# Patient Record
Sex: Female | Born: 1935 | Race: White | Hispanic: No | Marital: Married | Smoking: Never smoker
Health system: Southern US, Community
[De-identification: ages and names within clinical notes are randomized; demographics above are authoritative.]

## PROBLEM LIST (undated history)

## (undated) DIAGNOSIS — E785 Hyperlipidemia, unspecified: Secondary | ICD-10-CM

## (undated) DIAGNOSIS — D649 Anemia, unspecified: Secondary | ICD-10-CM

## (undated) DIAGNOSIS — M810 Age-related osteoporosis without current pathological fracture: Secondary | ICD-10-CM

## (undated) DIAGNOSIS — M1990 Unspecified osteoarthritis, unspecified site: Secondary | ICD-10-CM

## (undated) DIAGNOSIS — M75102 Unspecified rotator cuff tear or rupture of left shoulder, not specified as traumatic: Secondary | ICD-10-CM

## (undated) DIAGNOSIS — I4891 Unspecified atrial fibrillation: Secondary | ICD-10-CM

## (undated) HISTORY — DX: Unspecified atrial fibrillation: I48.91

## (undated) HISTORY — PX: APPENDECTOMY: SHX54

## (undated) HISTORY — DX: Anemia, unspecified: D64.9

## (undated) HISTORY — PX: CATARACT EXTRACTION, BILATERAL: SHX1313

## (undated) HISTORY — DX: Unspecified osteoarthritis, unspecified site: M19.90

## (undated) HISTORY — DX: Age-related osteoporosis without current pathological fracture: M81.0

## (undated) HISTORY — DX: Unspecified rotator cuff tear or rupture of left shoulder, not specified as traumatic: M75.102

## (undated) HISTORY — PX: BILATERAL CARPAL TUNNEL RELEASE: SHX6508

## (undated) HISTORY — DX: Hyperlipidemia, unspecified: E78.5

---

## 2003-12-12 ENCOUNTER — Ambulatory Visit: Payer: Self-pay | Admitting: Family Medicine

## 2006-03-29 ENCOUNTER — Ambulatory Visit: Payer: Self-pay | Admitting: Family Medicine

## 2006-05-05 ENCOUNTER — Other Ambulatory Visit: Payer: Self-pay

## 2006-05-06 ENCOUNTER — Observation Stay: Payer: Self-pay | Admitting: Internal Medicine

## 2006-05-28 ENCOUNTER — Ambulatory Visit: Payer: Self-pay | Admitting: Unknown Physician Specialty

## 2006-06-12 ENCOUNTER — Ambulatory Visit: Payer: Self-pay | Admitting: Unknown Physician Specialty

## 2006-06-15 ENCOUNTER — Ambulatory Visit: Payer: Self-pay | Admitting: Unknown Physician Specialty

## 2006-07-05 ENCOUNTER — Ambulatory Visit: Payer: Self-pay | Admitting: Unknown Physician Specialty

## 2006-07-05 ENCOUNTER — Other Ambulatory Visit: Payer: Self-pay

## 2006-07-08 ENCOUNTER — Ambulatory Visit: Payer: Self-pay | Admitting: Unknown Physician Specialty

## 2006-08-04 ENCOUNTER — Ambulatory Visit: Payer: Self-pay | Admitting: Family Medicine

## 2006-11-29 ENCOUNTER — Ambulatory Visit: Payer: Self-pay | Admitting: Family Medicine

## 2009-05-25 ENCOUNTER — Ambulatory Visit: Payer: Self-pay | Admitting: Internal Medicine

## 2010-05-12 ENCOUNTER — Ambulatory Visit: Payer: Self-pay | Admitting: Internal Medicine

## 2010-09-21 ENCOUNTER — Inpatient Hospital Stay: Payer: Self-pay | Admitting: Internal Medicine

## 2010-11-17 ENCOUNTER — Emergency Department: Payer: Self-pay | Admitting: Emergency Medicine

## 2011-01-23 ENCOUNTER — Ambulatory Visit: Payer: Self-pay | Admitting: Internal Medicine

## 2011-02-12 ENCOUNTER — Ambulatory Visit: Payer: Self-pay | Admitting: Internal Medicine

## 2011-02-12 LAB — CBC CANCER CENTER
Eosinophil %: 0.9 %
Eosinophil: 1 %
HGB: 9.8 g/dL — ABNORMAL LOW (ref 12.0–16.0)
Lymphocyte %: 22.9 %
Lymphocytes: 26 %
MCH: 24.5 pg — ABNORMAL LOW (ref 26.0–34.0)
Monocyte %: 4.5 %
Neutrophil %: 71.5 %
Platelet: 333 x10 3/mm (ref 150–440)
RBC: 3.98 10*6/uL (ref 3.80–5.20)
RDW: 15.5 % — ABNORMAL HIGH (ref 11.5–14.5)
Segmented Neutrophils: 69 %
WBC: 9.1 x10 3/mm (ref 3.6–11.0)

## 2011-02-12 LAB — RETICULOCYTES
Absolute Retic Count: 0.145 10*6/uL — ABNORMAL HIGH (ref 0.024–0.084)
Reticulocyte: 3.6 % — ABNORMAL HIGH (ref 0.5–1.5)

## 2011-02-12 LAB — FERRITIN: Ferritin (ARMC): 8 ng/mL (ref 8–388)

## 2011-02-18 LAB — URINE IEP, RANDOM

## 2011-02-27 ENCOUNTER — Ambulatory Visit: Payer: Self-pay | Admitting: Internal Medicine

## 2011-04-09 ENCOUNTER — Ambulatory Visit: Payer: Self-pay | Admitting: Internal Medicine

## 2011-04-09 LAB — CBC CANCER CENTER
Basophil #: 0 x10 3/mm (ref 0.0–0.1)
Eosinophil %: 0 %
HGB: 9.1 g/dL — ABNORMAL LOW (ref 12.0–16.0)
Lymphocyte #: 1.4 x10 3/mm (ref 1.0–3.6)
MCH: 22.5 pg — ABNORMAL LOW (ref 26.0–34.0)
MCHC: 30.6 g/dL — ABNORMAL LOW (ref 32.0–36.0)
MCV: 73.4 fL — ABNORMAL LOW (ref 80–100)
Neutrophil %: 80.2 %
RBC: 4.06 10*6/uL (ref 3.80–5.20)
RDW: 16.7 % — ABNORMAL HIGH (ref 11.5–14.5)

## 2011-04-09 LAB — RETICULOCYTES
Absolute Retic Count: 0.121 10*6/uL — ABNORMAL HIGH (ref 0.024–0.084)
Reticulocyte: 3.1 % — ABNORMAL HIGH (ref 0.5–1.5)

## 2011-04-27 ENCOUNTER — Ambulatory Visit: Payer: Self-pay | Admitting: Internal Medicine

## 2011-04-30 ENCOUNTER — Emergency Department: Payer: Self-pay | Admitting: Emergency Medicine

## 2011-04-30 LAB — URINALYSIS, COMPLETE
Bacteria: NONE SEEN
Bilirubin,UR: NEGATIVE
Blood: NEGATIVE
Glucose,UR: NEGATIVE mg/dL (ref 0–75)
Ketone: NEGATIVE
Leukocyte Esterase: NEGATIVE
Nitrite: NEGATIVE
Protein: NEGATIVE
Specific Gravity: 1.009 (ref 1.003–1.030)
Squamous Epithelial: <1

## 2011-04-30 LAB — CBC
MCV: 72 fL — ABNORMAL LOW (ref 80–100)
RBC: 4.29 10*6/uL (ref 3.80–5.20)
WBC: 11.2 10*3/uL — ABNORMAL HIGH (ref 3.6–11.0)

## 2011-04-30 LAB — BASIC METABOLIC PANEL
Anion Gap: 12 (ref 7–16)
Calcium, Total: 8.6 mg/dL (ref 8.5–10.1)
Co2: 28 mmol/L (ref 21–32)
Creatinine: 0.67 mg/dL (ref 0.60–1.30)
EGFR (African American): >60
Glucose: 94 mg/dL (ref 65–99)
Osmolality: 284 (ref 275–301)
Potassium: 3.1 mmol/L — ABNORMAL LOW (ref 3.5–5.1)

## 2011-04-30 LAB — PROTIME-INR: Prothrombin Time: 28.3 secs — ABNORMAL HIGH (ref 11.5–14.7)

## 2011-04-30 LAB — TROPONIN I: Troponin-I: <0.02 ng/mL

## 2011-07-28 ENCOUNTER — Emergency Department: Payer: Self-pay | Admitting: Emergency Medicine

## 2011-07-28 LAB — URINALYSIS, COMPLETE
Bacteria: NONE SEEN
Bilirubin,UR: NEGATIVE
Blood: NEGATIVE
Glucose,UR: NEGATIVE mg/dL (ref 0–75)
Ketone: NEGATIVE
Nitrite: NEGATIVE
Specific Gravity: 1.021 (ref 1.003–1.030)
Squamous Epithelial: 1
WBC UR: 1 /HPF (ref 0–5)

## 2011-07-28 LAB — TROPONIN I
Troponin-I: <0.02 ng/mL
Troponin-I: <0.02 ng/mL

## 2011-07-28 LAB — CBC
HCT: 35.9 % (ref 35.0–47.0)
HGB: 10.8 g/dL — ABNORMAL LOW (ref 12.0–16.0)
RBC: 4.91 10*6/uL (ref 3.80–5.20)

## 2011-07-28 LAB — CK TOTAL AND CKMB (NOT AT ARMC)
CK, Total: 37 U/L (ref 21–215)
CK-MB: 0.8 ng/mL (ref 0.5–3.6)

## 2011-07-28 LAB — T4, FREE: Free Thyroxine: 0.99 ng/dL (ref 0.76–1.46)

## 2011-07-28 LAB — COMPREHENSIVE METABOLIC PANEL
Albumin: 3.5 g/dL (ref 3.4–5.0)
Alkaline Phosphatase: 81 U/L (ref 50–136)
Anion Gap: 6 — ABNORMAL LOW (ref 7–16)
Calcium, Total: 9 mg/dL (ref 8.5–10.1)
EGFR (Non-African Amer.): >60
Glucose: 109 mg/dL — ABNORMAL HIGH (ref 65–99)
Potassium: 4.3 mmol/L (ref 3.5–5.1)
SGOT(AST): 17 U/L (ref 15–37)
SGPT (ALT): 20 U/L
Total Protein: 7.5 g/dL (ref 6.4–8.2)

## 2011-07-28 LAB — PROTIME-INR: INR: 2.1

## 2011-07-28 LAB — TSH: Thyroid Stimulating Horm: 0.945 u[IU]/mL

## 2011-08-06 ENCOUNTER — Ambulatory Visit: Payer: Self-pay | Admitting: Internal Medicine

## 2011-08-06 LAB — CBC CANCER CENTER
Basophil #: 0 x10 3/mm (ref 0.0–0.1)
Basophil %: 0.2 %
Eosinophil #: 0 x10 3/mm (ref 0.0–0.7)
Lymphocyte %: 9 %
MCHC: 30.3 g/dL — ABNORMAL LOW (ref 32.0–36.0)
Monocyte #: 0.5 x10 3/mm (ref 0.2–0.9)
Neutrophil #: 10.7 x10 3/mm — ABNORMAL HIGH (ref 1.4–6.5)
Neutrophil %: 86.6 %
RBC: 4.28 10*6/uL (ref 3.80–5.20)
RDW: 19.4 % — ABNORMAL HIGH (ref 11.5–14.5)
WBC: 12.4 x10 3/mm — ABNORMAL HIGH (ref 3.6–11.0)

## 2011-08-06 LAB — IRON AND TIBC
Iron Bind.Cap.(Total): 423 ug/dL (ref 250–450)
Iron Saturation: 16 %
Iron: 68 ug/dL (ref 50–170)
Unbound Iron-Bind.Cap.: 355 ug/dL

## 2011-08-06 LAB — FERRITIN: Ferritin (ARMC): 14 ng/mL (ref 8–388)

## 2011-08-27 ENCOUNTER — Ambulatory Visit: Payer: Self-pay | Admitting: Internal Medicine

## 2011-11-19 ENCOUNTER — Ambulatory Visit: Payer: Self-pay | Admitting: Internal Medicine

## 2011-11-19 LAB — CBC CANCER CENTER
Eosinophil #: 0.2 x10 3/mm (ref 0.0–0.7)
HCT: 33.3 % — ABNORMAL LOW (ref 35.0–47.0)
HGB: 10.2 g/dL — ABNORMAL LOW (ref 12.0–16.0)
Lymphocyte #: 1.7 x10 3/mm (ref 1.0–3.6)
MCHC: 30.5 g/dL — ABNORMAL LOW (ref 32.0–36.0)
Monocyte #: 0.6 x10 3/mm (ref 0.2–0.9)
Neutrophil %: 71.6 %
RBC: 4.37 10*6/uL (ref 3.80–5.20)
WBC: 8.8 x10 3/mm (ref 3.6–11.0)

## 2011-11-19 LAB — IRON AND TIBC
Iron Bind.Cap.(Total): 432 ug/dL (ref 250–450)
Iron: 25 ug/dL — ABNORMAL LOW (ref 50–170)
Unbound Iron-Bind.Cap.: 407 ug/dL

## 2011-11-27 ENCOUNTER — Ambulatory Visit: Payer: Self-pay | Admitting: Internal Medicine

## 2011-12-27 ENCOUNTER — Ambulatory Visit: Payer: Self-pay | Admitting: Internal Medicine

## 2012-01-27 ENCOUNTER — Ambulatory Visit: Payer: Self-pay | Admitting: Internal Medicine

## 2012-02-18 ENCOUNTER — Ambulatory Visit: Payer: Self-pay | Admitting: Rheumatology

## 2012-02-18 LAB — CBC CANCER CENTER
Basophil #: 0.1 x10 3/mm (ref 0.0–0.1)
Basophil %: 0.6 %
HCT: 39.6 % (ref 35.0–47.0)
HGB: 12.6 g/dL (ref 12.0–16.0)
Lymphocyte #: 1.8 x10 3/mm (ref 1.0–3.6)
Lymphocyte %: 15.6 %
MCH: 25.2 pg — ABNORMAL LOW (ref 26.0–34.0)
MCV: 79 fL — ABNORMAL LOW (ref 80–100)
Monocyte #: 0.5 x10 3/mm (ref 0.2–0.9)
Neutrophil %: 78.9 %
Platelet: 262 x10 3/mm (ref 150–440)
RBC: 5 10*6/uL (ref 3.80–5.20)
RDW: 19.7 % — ABNORMAL HIGH (ref 11.5–14.5)

## 2012-02-18 LAB — IRON AND TIBC
Iron Bind.Cap.(Total): 371 ug/dL (ref 250–450)
Iron Saturation: 11 %
Iron: 42 ug/dL — ABNORMAL LOW (ref 50–170)

## 2012-02-18 LAB — CREATININE, SERUM: EGFR (Non-African Amer.): >60

## 2012-02-18 LAB — FERRITIN: Ferritin (ARMC): 76 ng/mL (ref 8–388)

## 2012-02-18 LAB — SEDIMENTATION RATE: Erythrocyte Sed Rate: 21 mm/hr (ref 0–30)

## 2012-02-27 ENCOUNTER — Ambulatory Visit: Payer: Self-pay | Admitting: Internal Medicine

## 2012-03-26 ENCOUNTER — Ambulatory Visit: Payer: Self-pay | Admitting: Internal Medicine

## 2012-04-26 ENCOUNTER — Ambulatory Visit: Payer: Self-pay | Admitting: Internal Medicine

## 2012-05-26 ENCOUNTER — Ambulatory Visit: Payer: Self-pay | Admitting: Internal Medicine

## 2012-05-26 LAB — IRON AND TIBC
Iron Bind.Cap.(Total): 335 ug/dL (ref 250–450)
Iron: 71 ug/dL (ref 50–170)
Unbound Iron-Bind.Cap.: 264 ug/dL

## 2012-05-26 LAB — CANCER CENTER HEMOGLOBIN: HGB: 12.9 g/dL (ref 12.0–16.0)

## 2012-05-26 LAB — FERRITIN: Ferritin (ARMC): 88 ng/mL (ref 8–388)

## 2012-06-26 ENCOUNTER — Ambulatory Visit: Payer: Self-pay | Admitting: Internal Medicine

## 2012-07-26 ENCOUNTER — Ambulatory Visit: Payer: Self-pay | Admitting: Internal Medicine

## 2012-08-26 ENCOUNTER — Ambulatory Visit: Payer: Self-pay | Admitting: Internal Medicine

## 2012-09-15 LAB — CBC CANCER CENTER
Basophil #: 0.1 x10 3/mm (ref 0.0–0.1)
Basophil %: 1.1 %
Eosinophil #: 0.3 x10 3/mm (ref 0.0–0.7)
Eosinophil %: 5.1 %
HCT: 42.1 % (ref 35.0–47.0)
HGB: 13.9 g/dL (ref 12.0–16.0)
Lymphocyte %: 48.9 %
MCH: 29.1 pg (ref 26.0–34.0)
Monocyte #: 0.7 x10 3/mm (ref 0.2–0.9)
Monocyte %: 12.2 %
Neutrophil #: 1.8 x10 3/mm (ref 1.4–6.5)
Platelet: 177 x10 3/mm (ref 150–440)
RBC: 4.78 10*6/uL (ref 3.80–5.20)
RDW: 13.8 % (ref 11.5–14.5)
WBC: 5.5 x10 3/mm (ref 3.6–11.0)

## 2012-09-15 LAB — IRON AND TIBC
Iron Bind.Cap.(Total): 419 ug/dL (ref 250–450)
Iron Saturation: 34 %
Unbound Iron-Bind.Cap.: 278 ug/dL

## 2012-09-15 LAB — FERRITIN: Ferritin (ARMC): 94 ng/mL (ref 8–388)

## 2012-09-26 ENCOUNTER — Ambulatory Visit: Payer: Self-pay | Admitting: Internal Medicine

## 2012-12-21 ENCOUNTER — Ambulatory Visit: Payer: Self-pay | Admitting: Family Medicine

## 2012-12-21 LAB — CBC WITH DIFFERENTIAL/PLATELET
Basophil #: 0.1 10*3/uL (ref 0.0–0.1)
Eosinophil #: 0.3 10*3/uL (ref 0.0–0.7)
HGB: 14.2 g/dL (ref 12.0–16.0)
Lymphocyte #: 2.1 10*3/uL (ref 1.0–3.6)
Lymphocyte %: 37.6 %
MCHC: 33.2 g/dL (ref 32.0–36.0)
MCV: 90 fL (ref 80–100)
Monocyte #: 0.8 x10 3/mm (ref 0.2–0.9)
Neutrophil #: 2.3 10*3/uL (ref 1.4–6.5)
RBC: 4.76 10*6/uL (ref 3.80–5.20)
RDW: 13.4 % (ref 11.5–14.5)
WBC: 5.6 10*3/uL (ref 3.6–11.0)

## 2013-02-01 ENCOUNTER — Ambulatory Visit: Payer: Self-pay | Admitting: Internal Medicine

## 2013-02-01 DIAGNOSIS — D509 Iron deficiency anemia, unspecified: Secondary | ICD-10-CM | POA: Diagnosis not present

## 2013-02-01 LAB — HEMOGLOBIN: HGB: 14.5 g/dL (ref 12.0–16.0)

## 2013-02-01 LAB — IRON AND TIBC
Iron Bind.Cap.(Total): 454 ug/dL — ABNORMAL HIGH (ref 250–450)
Iron Saturation: 27 %
Iron: 121 ug/dL (ref 50–170)
UNBOUND IRON-BIND. CAP.: 333 ug/dL

## 2013-02-01 LAB — FERRITIN: Ferritin (ARMC): 45 ng/mL (ref 8–388)

## 2013-02-07 DIAGNOSIS — I6529 Occlusion and stenosis of unspecified carotid artery: Secondary | ICD-10-CM | POA: Diagnosis not present

## 2013-02-07 DIAGNOSIS — I4949 Other premature depolarization: Secondary | ICD-10-CM | POA: Diagnosis not present

## 2013-02-07 DIAGNOSIS — M79609 Pain in unspecified limb: Secondary | ICD-10-CM | POA: Diagnosis not present

## 2013-02-07 DIAGNOSIS — I4891 Unspecified atrial fibrillation: Secondary | ICD-10-CM | POA: Diagnosis not present

## 2013-02-09 DIAGNOSIS — Z7901 Long term (current) use of anticoagulants: Secondary | ICD-10-CM | POA: Diagnosis not present

## 2013-02-23 DIAGNOSIS — Z7901 Long term (current) use of anticoagulants: Secondary | ICD-10-CM | POA: Diagnosis not present

## 2013-02-26 ENCOUNTER — Ambulatory Visit: Payer: Self-pay | Admitting: Internal Medicine

## 2013-02-27 DIAGNOSIS — M069 Rheumatoid arthritis, unspecified: Secondary | ICD-10-CM | POA: Diagnosis not present

## 2013-02-27 DIAGNOSIS — M810 Age-related osteoporosis without current pathological fracture: Secondary | ICD-10-CM | POA: Diagnosis not present

## 2013-02-27 DIAGNOSIS — D649 Anemia, unspecified: Secondary | ICD-10-CM | POA: Diagnosis not present

## 2013-03-16 DIAGNOSIS — M069 Rheumatoid arthritis, unspecified: Secondary | ICD-10-CM | POA: Diagnosis not present

## 2013-03-16 DIAGNOSIS — I517 Cardiomegaly: Secondary | ICD-10-CM | POA: Diagnosis not present

## 2013-03-16 DIAGNOSIS — I119 Hypertensive heart disease without heart failure: Secondary | ICD-10-CM | POA: Diagnosis not present

## 2013-03-16 DIAGNOSIS — I4891 Unspecified atrial fibrillation: Secondary | ICD-10-CM | POA: Diagnosis not present

## 2013-03-16 DIAGNOSIS — Z79899 Other long term (current) drug therapy: Secondary | ICD-10-CM | POA: Diagnosis not present

## 2013-03-16 DIAGNOSIS — I359 Nonrheumatic aortic valve disorder, unspecified: Secondary | ICD-10-CM | POA: Diagnosis not present

## 2013-03-22 DIAGNOSIS — Z7901 Long term (current) use of anticoagulants: Secondary | ICD-10-CM | POA: Diagnosis not present

## 2013-04-06 DIAGNOSIS — M069 Rheumatoid arthritis, unspecified: Secondary | ICD-10-CM | POA: Diagnosis not present

## 2013-04-20 DIAGNOSIS — Z7901 Long term (current) use of anticoagulants: Secondary | ICD-10-CM | POA: Diagnosis not present

## 2013-04-20 DIAGNOSIS — E7800 Pure hypercholesterolemia, unspecified: Secondary | ICD-10-CM | POA: Diagnosis not present

## 2013-04-21 DIAGNOSIS — Z7901 Long term (current) use of anticoagulants: Secondary | ICD-10-CM | POA: Diagnosis not present

## 2013-04-21 DIAGNOSIS — I4891 Unspecified atrial fibrillation: Secondary | ICD-10-CM | POA: Diagnosis not present

## 2013-04-21 DIAGNOSIS — I10 Essential (primary) hypertension: Secondary | ICD-10-CM | POA: Diagnosis not present

## 2013-04-21 DIAGNOSIS — E7800 Pure hypercholesterolemia, unspecified: Secondary | ICD-10-CM | POA: Diagnosis not present

## 2013-05-04 DIAGNOSIS — Z7901 Long term (current) use of anticoagulants: Secondary | ICD-10-CM | POA: Diagnosis not present

## 2013-05-15 DIAGNOSIS — M069 Rheumatoid arthritis, unspecified: Secondary | ICD-10-CM | POA: Diagnosis not present

## 2013-05-18 DIAGNOSIS — Z7901 Long term (current) use of anticoagulants: Secondary | ICD-10-CM | POA: Diagnosis not present

## 2013-06-01 ENCOUNTER — Ambulatory Visit: Payer: Self-pay | Admitting: Internal Medicine

## 2013-06-01 DIAGNOSIS — D509 Iron deficiency anemia, unspecified: Secondary | ICD-10-CM | POA: Diagnosis not present

## 2013-06-01 DIAGNOSIS — Z79899 Other long term (current) drug therapy: Secondary | ICD-10-CM | POA: Diagnosis not present

## 2013-06-01 LAB — IRON AND TIBC
IRON BIND. CAP.(TOTAL): 435 ug/dL (ref 250–450)
Iron Saturation: 9 %
Iron: 39 ug/dL — ABNORMAL LOW (ref 50–170)
Unbound Iron-Bind.Cap.: 396 ug/dL

## 2013-06-01 LAB — HEMOGLOBIN: HGB: 11.9 g/dL — ABNORMAL LOW (ref 12.0–16.0)

## 2013-06-01 LAB — FERRITIN: FERRITIN (ARMC): 32 ng/mL (ref 8–388)

## 2013-06-07 LAB — OCCULT BLOOD X 1 CARD TO LAB, STOOL
Occult Blood, Feces: POSITIVE
Occult Blood, Feces: NEGATIVE

## 2013-06-15 DIAGNOSIS — Z7901 Long term (current) use of anticoagulants: Secondary | ICD-10-CM | POA: Diagnosis not present

## 2013-06-28 ENCOUNTER — Ambulatory Visit: Payer: Self-pay | Admitting: Internal Medicine

## 2013-06-28 DIAGNOSIS — D509 Iron deficiency anemia, unspecified: Secondary | ICD-10-CM | POA: Diagnosis not present

## 2013-06-28 DIAGNOSIS — Z79899 Other long term (current) drug therapy: Secondary | ICD-10-CM | POA: Diagnosis not present

## 2013-07-12 ENCOUNTER — Emergency Department: Payer: Self-pay | Admitting: Emergency Medicine

## 2013-07-12 DIAGNOSIS — S0993XA Unspecified injury of face, initial encounter: Secondary | ICD-10-CM | POA: Diagnosis not present

## 2013-07-12 DIAGNOSIS — S0990XA Unspecified injury of head, initial encounter: Secondary | ICD-10-CM | POA: Diagnosis not present

## 2013-07-12 DIAGNOSIS — Z7901 Long term (current) use of anticoagulants: Secondary | ICD-10-CM | POA: Diagnosis not present

## 2013-07-12 DIAGNOSIS — I10 Essential (primary) hypertension: Secondary | ICD-10-CM | POA: Diagnosis not present

## 2013-07-12 DIAGNOSIS — M542 Cervicalgia: Secondary | ICD-10-CM | POA: Diagnosis not present

## 2013-07-12 DIAGNOSIS — I4891 Unspecified atrial fibrillation: Secondary | ICD-10-CM | POA: Diagnosis not present

## 2013-07-12 DIAGNOSIS — S42309A Unspecified fracture of shaft of humerus, unspecified arm, initial encounter for closed fracture: Secondary | ICD-10-CM | POA: Diagnosis not present

## 2013-07-12 DIAGNOSIS — S46909A Unspecified injury of unspecified muscle, fascia and tendon at shoulder and upper arm level, unspecified arm, initial encounter: Secondary | ICD-10-CM | POA: Diagnosis not present

## 2013-07-12 DIAGNOSIS — R51 Headache: Secondary | ICD-10-CM | POA: Diagnosis not present

## 2013-07-12 DIAGNOSIS — S4980XA Other specified injuries of shoulder and upper arm, unspecified arm, initial encounter: Secondary | ICD-10-CM | POA: Diagnosis not present

## 2013-07-12 DIAGNOSIS — S42293A Other displaced fracture of upper end of unspecified humerus, initial encounter for closed fracture: Secondary | ICD-10-CM | POA: Diagnosis not present

## 2013-07-12 DIAGNOSIS — IMO0002 Reserved for concepts with insufficient information to code with codable children: Secondary | ICD-10-CM | POA: Diagnosis not present

## 2013-07-12 LAB — BASIC METABOLIC PANEL
Anion Gap: 5 — ABNORMAL LOW (ref 7–16)
BUN: 15 mg/dL (ref 7–18)
CHLORIDE: 107 mmol/L (ref 98–107)
CO2: 27 mmol/L (ref 21–32)
CREATININE: 0.42 mg/dL — AB (ref 0.60–1.30)
Calcium, Total: 8.5 mg/dL (ref 8.5–10.1)
Glucose: 89 mg/dL (ref 65–99)
OSMOLALITY: 278 (ref 275–301)
Potassium: 4.5 mmol/L (ref 3.5–5.1)
SODIUM: 139 mmol/L (ref 136–145)

## 2013-07-12 LAB — CBC WITH DIFFERENTIAL/PLATELET
BASOS ABS: 0.1 10*3/uL (ref 0.0–0.1)
Basophil %: 0.5 %
Eosinophil #: 0.1 10*3/uL (ref 0.0–0.7)
Eosinophil %: 1.1 %
HCT: 38.1 % (ref 35.0–47.0)
HGB: 11.8 g/dL — ABNORMAL LOW (ref 12.0–16.0)
Lymphocyte #: 2 10*3/uL (ref 1.0–3.6)
Lymphocyte %: 21 %
MCH: 26.6 pg (ref 26.0–34.0)
MCHC: 31.1 g/dL — AB (ref 32.0–36.0)
MCV: 86 fL (ref 80–100)
MONO ABS: 0.7 x10 3/mm (ref 0.2–0.9)
Monocyte %: 6.8 %
NEUTROS PCT: 70.6 %
Neutrophil #: 6.8 10*3/uL — ABNORMAL HIGH (ref 1.4–6.5)
Platelet: 224 10*3/uL (ref 150–440)
RBC: 4.46 10*6/uL (ref 3.80–5.20)
RDW: 16.2 % — AB (ref 11.5–14.5)
WBC: 9.7 10*3/uL (ref 3.6–11.0)

## 2013-07-12 LAB — PROTIME-INR
INR: 1.9
Prothrombin Time: 21.4 secs — ABNORMAL HIGH (ref 11.5–14.7)

## 2013-07-17 DIAGNOSIS — S42213A Unspecified displaced fracture of surgical neck of unspecified humerus, initial encounter for closed fracture: Secondary | ICD-10-CM | POA: Diagnosis not present

## 2013-07-17 DIAGNOSIS — S42293A Other displaced fracture of upper end of unspecified humerus, initial encounter for closed fracture: Secondary | ICD-10-CM | POA: Diagnosis not present

## 2013-07-26 ENCOUNTER — Ambulatory Visit: Payer: Self-pay | Admitting: Internal Medicine

## 2013-07-31 DIAGNOSIS — S42293A Other displaced fracture of upper end of unspecified humerus, initial encounter for closed fracture: Secondary | ICD-10-CM | POA: Diagnosis not present

## 2013-08-01 DIAGNOSIS — Z7901 Long term (current) use of anticoagulants: Secondary | ICD-10-CM | POA: Diagnosis not present

## 2013-08-15 DIAGNOSIS — I38 Endocarditis, valve unspecified: Secondary | ICD-10-CM | POA: Diagnosis not present

## 2013-08-15 DIAGNOSIS — I4891 Unspecified atrial fibrillation: Secondary | ICD-10-CM | POA: Diagnosis not present

## 2013-08-15 DIAGNOSIS — I10 Essential (primary) hypertension: Secondary | ICD-10-CM | POA: Diagnosis not present

## 2013-08-15 DIAGNOSIS — I6529 Occlusion and stenosis of unspecified carotid artery: Secondary | ICD-10-CM | POA: Diagnosis not present

## 2013-08-28 DIAGNOSIS — S42293A Other displaced fracture of upper end of unspecified humerus, initial encounter for closed fracture: Secondary | ICD-10-CM | POA: Diagnosis not present

## 2013-08-30 DIAGNOSIS — Z7901 Long term (current) use of anticoagulants: Secondary | ICD-10-CM | POA: Diagnosis not present

## 2013-09-04 DIAGNOSIS — M069 Rheumatoid arthritis, unspecified: Secondary | ICD-10-CM | POA: Diagnosis not present

## 2013-09-04 DIAGNOSIS — M810 Age-related osteoporosis without current pathological fracture: Secondary | ICD-10-CM | POA: Diagnosis not present

## 2013-09-06 DIAGNOSIS — M25619 Stiffness of unspecified shoulder, not elsewhere classified: Secondary | ICD-10-CM | POA: Diagnosis not present

## 2013-09-06 DIAGNOSIS — S42293A Other displaced fracture of upper end of unspecified humerus, initial encounter for closed fracture: Secondary | ICD-10-CM | POA: Diagnosis not present

## 2013-09-06 DIAGNOSIS — M25519 Pain in unspecified shoulder: Secondary | ICD-10-CM | POA: Diagnosis not present

## 2013-09-06 DIAGNOSIS — S42213A Unspecified displaced fracture of surgical neck of unspecified humerus, initial encounter for closed fracture: Secondary | ICD-10-CM | POA: Diagnosis not present

## 2013-09-08 DIAGNOSIS — S42293A Other displaced fracture of upper end of unspecified humerus, initial encounter for closed fracture: Secondary | ICD-10-CM | POA: Diagnosis not present

## 2013-09-08 DIAGNOSIS — M25519 Pain in unspecified shoulder: Secondary | ICD-10-CM | POA: Diagnosis not present

## 2013-09-08 DIAGNOSIS — S42213A Unspecified displaced fracture of surgical neck of unspecified humerus, initial encounter for closed fracture: Secondary | ICD-10-CM | POA: Diagnosis not present

## 2013-09-08 DIAGNOSIS — M25619 Stiffness of unspecified shoulder, not elsewhere classified: Secondary | ICD-10-CM | POA: Diagnosis not present

## 2013-09-11 DIAGNOSIS — M25519 Pain in unspecified shoulder: Secondary | ICD-10-CM | POA: Diagnosis not present

## 2013-09-11 DIAGNOSIS — S42293A Other displaced fracture of upper end of unspecified humerus, initial encounter for closed fracture: Secondary | ICD-10-CM | POA: Diagnosis not present

## 2013-09-11 DIAGNOSIS — M25619 Stiffness of unspecified shoulder, not elsewhere classified: Secondary | ICD-10-CM | POA: Diagnosis not present

## 2013-09-11 DIAGNOSIS — S42213A Unspecified displaced fracture of surgical neck of unspecified humerus, initial encounter for closed fracture: Secondary | ICD-10-CM | POA: Diagnosis not present

## 2013-09-13 DIAGNOSIS — I38 Endocarditis, valve unspecified: Secondary | ICD-10-CM | POA: Diagnosis not present

## 2013-09-13 DIAGNOSIS — Z7901 Long term (current) use of anticoagulants: Secondary | ICD-10-CM | POA: Diagnosis not present

## 2013-09-13 DIAGNOSIS — E785 Hyperlipidemia, unspecified: Secondary | ICD-10-CM | POA: Diagnosis not present

## 2013-09-13 DIAGNOSIS — I10 Essential (primary) hypertension: Secondary | ICD-10-CM | POA: Diagnosis not present

## 2013-09-13 DIAGNOSIS — I4891 Unspecified atrial fibrillation: Secondary | ICD-10-CM | POA: Diagnosis not present

## 2013-09-14 ENCOUNTER — Ambulatory Visit: Payer: Self-pay | Admitting: Internal Medicine

## 2013-09-14 DIAGNOSIS — M810 Age-related osteoporosis without current pathological fracture: Secondary | ICD-10-CM | POA: Diagnosis not present

## 2013-09-14 DIAGNOSIS — D509 Iron deficiency anemia, unspecified: Secondary | ICD-10-CM | POA: Diagnosis not present

## 2013-09-14 DIAGNOSIS — I10 Essential (primary) hypertension: Secondary | ICD-10-CM | POA: Diagnosis not present

## 2013-09-14 DIAGNOSIS — D649 Anemia, unspecified: Secondary | ICD-10-CM | POA: Diagnosis not present

## 2013-09-14 DIAGNOSIS — R5383 Other fatigue: Secondary | ICD-10-CM | POA: Diagnosis not present

## 2013-09-14 DIAGNOSIS — R5381 Other malaise: Secondary | ICD-10-CM | POA: Diagnosis not present

## 2013-09-14 DIAGNOSIS — E785 Hyperlipidemia, unspecified: Secondary | ICD-10-CM | POA: Diagnosis not present

## 2013-09-14 DIAGNOSIS — M069 Rheumatoid arthritis, unspecified: Secondary | ICD-10-CM | POA: Diagnosis not present

## 2013-09-14 DIAGNOSIS — Z79899 Other long term (current) drug therapy: Secondary | ICD-10-CM | POA: Diagnosis not present

## 2013-09-14 DIAGNOSIS — I4891 Unspecified atrial fibrillation: Secondary | ICD-10-CM | POA: Diagnosis not present

## 2013-09-14 LAB — CBC CANCER CENTER
Basophil #: 0 x10 3/mm (ref 0.0–0.1)
Basophil %: 0.8 %
EOS ABS: 0.3 x10 3/mm (ref 0.0–0.7)
EOS PCT: 4.2 %
HCT: 39 % (ref 35.0–47.0)
HGB: 12.3 g/dL (ref 12.0–16.0)
LYMPHS PCT: 38.3 %
Lymphocyte #: 2.4 x10 3/mm (ref 1.0–3.6)
MCH: 27 pg (ref 26.0–34.0)
MCHC: 31.6 g/dL — ABNORMAL LOW (ref 32.0–36.0)
MCV: 85 fL (ref 80–100)
Monocyte #: 0.5 x10 3/mm (ref 0.2–0.9)
Monocyte %: 7.8 %
Neutrophil #: 3.1 x10 3/mm (ref 1.4–6.5)
Neutrophil %: 48.9 %
Platelet: 248 x10 3/mm (ref 150–440)
RBC: 4.57 10*6/uL (ref 3.80–5.20)
RDW: 16 % — AB (ref 11.5–14.5)
WBC: 6.3 x10 3/mm (ref 3.6–11.0)

## 2013-09-14 LAB — IRON AND TIBC
IRON: 105 ug/dL (ref 50–170)
Iron Bind.Cap.(Total): 379 ug/dL (ref 250–450)
Iron Saturation: 28 %
UNBOUND IRON-BIND. CAP.: 274 ug/dL

## 2013-09-14 LAB — FERRITIN: FERRITIN (ARMC): 138 ng/mL (ref 8–388)

## 2013-09-26 ENCOUNTER — Ambulatory Visit: Payer: Self-pay | Admitting: Internal Medicine

## 2013-09-27 DIAGNOSIS — Z7901 Long term (current) use of anticoagulants: Secondary | ICD-10-CM | POA: Diagnosis not present

## 2013-10-11 DIAGNOSIS — Z7901 Long term (current) use of anticoagulants: Secondary | ICD-10-CM | POA: Diagnosis not present

## 2013-10-17 DIAGNOSIS — I38 Endocarditis, valve unspecified: Secondary | ICD-10-CM | POA: Diagnosis not present

## 2013-10-17 DIAGNOSIS — I4891 Unspecified atrial fibrillation: Secondary | ICD-10-CM | POA: Diagnosis not present

## 2013-10-17 DIAGNOSIS — I10 Essential (primary) hypertension: Secondary | ICD-10-CM | POA: Diagnosis not present

## 2013-10-17 DIAGNOSIS — E785 Hyperlipidemia, unspecified: Secondary | ICD-10-CM | POA: Diagnosis not present

## 2013-11-08 DIAGNOSIS — Z7901 Long term (current) use of anticoagulants: Secondary | ICD-10-CM | POA: Diagnosis not present

## 2013-11-08 DIAGNOSIS — I10 Essential (primary) hypertension: Secondary | ICD-10-CM | POA: Diagnosis not present

## 2013-11-08 DIAGNOSIS — E780 Pure hypercholesterolemia: Secondary | ICD-10-CM | POA: Diagnosis not present

## 2013-11-08 DIAGNOSIS — Z79899 Other long term (current) drug therapy: Secondary | ICD-10-CM | POA: Diagnosis not present

## 2013-11-15 DIAGNOSIS — I4891 Unspecified atrial fibrillation: Secondary | ICD-10-CM | POA: Diagnosis not present

## 2013-11-15 DIAGNOSIS — I10 Essential (primary) hypertension: Secondary | ICD-10-CM | POA: Diagnosis not present

## 2013-11-15 DIAGNOSIS — Z23 Encounter for immunization: Secondary | ICD-10-CM | POA: Diagnosis not present

## 2013-11-15 DIAGNOSIS — E780 Pure hypercholesterolemia: Secondary | ICD-10-CM | POA: Diagnosis not present

## 2013-11-15 DIAGNOSIS — K219 Gastro-esophageal reflux disease without esophagitis: Secondary | ICD-10-CM | POA: Diagnosis not present

## 2013-11-22 DIAGNOSIS — Z7901 Long term (current) use of anticoagulants: Secondary | ICD-10-CM | POA: Diagnosis not present

## 2013-12-05 DIAGNOSIS — M810 Age-related osteoporosis without current pathological fracture: Secondary | ICD-10-CM | POA: Diagnosis not present

## 2013-12-05 DIAGNOSIS — M0589 Other rheumatoid arthritis with rheumatoid factor of multiple sites: Secondary | ICD-10-CM | POA: Diagnosis not present

## 2013-12-06 DIAGNOSIS — Z7901 Long term (current) use of anticoagulants: Secondary | ICD-10-CM | POA: Diagnosis not present

## 2013-12-15 ENCOUNTER — Ambulatory Visit: Payer: Self-pay | Admitting: Family Medicine

## 2013-12-15 DIAGNOSIS — I700 Atherosclerosis of aorta: Secondary | ICD-10-CM | POA: Diagnosis not present

## 2013-12-15 DIAGNOSIS — R05 Cough: Secondary | ICD-10-CM | POA: Diagnosis not present

## 2013-12-15 DIAGNOSIS — I517 Cardiomegaly: Secondary | ICD-10-CM | POA: Diagnosis not present

## 2013-12-15 DIAGNOSIS — J329 Chronic sinusitis, unspecified: Secondary | ICD-10-CM | POA: Diagnosis not present

## 2013-12-15 DIAGNOSIS — J40 Bronchitis, not specified as acute or chronic: Secondary | ICD-10-CM | POA: Diagnosis not present

## 2014-01-04 ENCOUNTER — Ambulatory Visit: Payer: Self-pay | Admitting: Internal Medicine

## 2014-01-04 DIAGNOSIS — I4891 Unspecified atrial fibrillation: Secondary | ICD-10-CM | POA: Diagnosis not present

## 2014-01-04 DIAGNOSIS — D509 Iron deficiency anemia, unspecified: Secondary | ICD-10-CM | POA: Diagnosis not present

## 2014-01-04 DIAGNOSIS — Z79899 Other long term (current) drug therapy: Secondary | ICD-10-CM | POA: Diagnosis not present

## 2014-01-22 LAB — IRON AND TIBC
IRON BIND. CAP.(TOTAL): 386 ug/dL (ref 250–450)
IRON SATURATION: 43 %
IRON: 167 ug/dL (ref 50–170)
UNBOUND IRON-BIND. CAP.: 219 ug/dL

## 2014-01-22 LAB — CANCER CENTER HEMOGLOBIN: HGB: 12.6 g/dL (ref 12.0–16.0)

## 2014-01-22 LAB — FERRITIN: Ferritin (ARMC): 39 ng/mL (ref 8–388)

## 2014-01-26 ENCOUNTER — Ambulatory Visit: Payer: Self-pay | Admitting: Internal Medicine

## 2014-02-01 DIAGNOSIS — I4891 Unspecified atrial fibrillation: Secondary | ICD-10-CM | POA: Diagnosis not present

## 2014-02-06 DIAGNOSIS — E782 Mixed hyperlipidemia: Secondary | ICD-10-CM | POA: Diagnosis not present

## 2014-02-06 DIAGNOSIS — I482 Chronic atrial fibrillation: Secondary | ICD-10-CM | POA: Diagnosis not present

## 2014-02-06 DIAGNOSIS — I10 Essential (primary) hypertension: Secondary | ICD-10-CM | POA: Diagnosis not present

## 2014-02-06 DIAGNOSIS — I38 Endocarditis, valve unspecified: Secondary | ICD-10-CM | POA: Diagnosis not present

## 2014-02-08 DIAGNOSIS — I4891 Unspecified atrial fibrillation: Secondary | ICD-10-CM | POA: Diagnosis not present

## 2014-02-22 DIAGNOSIS — Z7901 Long term (current) use of anticoagulants: Secondary | ICD-10-CM | POA: Diagnosis not present

## 2014-03-08 DIAGNOSIS — Z7901 Long term (current) use of anticoagulants: Secondary | ICD-10-CM | POA: Diagnosis not present

## 2014-04-05 DIAGNOSIS — Z7901 Long term (current) use of anticoagulants: Secondary | ICD-10-CM | POA: Diagnosis not present

## 2014-04-18 DIAGNOSIS — Z7901 Long term (current) use of anticoagulants: Secondary | ICD-10-CM | POA: Diagnosis not present

## 2014-04-26 ENCOUNTER — Ambulatory Visit
Admit: 2014-04-26 | Disposition: A | Discharge status: Home / Self Care | Payer: Self-pay | Attending: Internal Medicine | Admitting: Internal Medicine

## 2014-04-26 DIAGNOSIS — D509 Iron deficiency anemia, unspecified: Secondary | ICD-10-CM | POA: Diagnosis not present

## 2014-04-26 LAB — FERRITIN: Ferritin (ARMC): 30 ng/mL

## 2014-04-26 LAB — IRON AND TIBC
IRON BIND. CAP.(TOTAL): 417 (ref 250–450)
Iron Saturation: 11
Iron: 46 ug/dL
Unbound Iron-Bind.Cap.: 370.9

## 2014-04-26 LAB — CANCER CENTER HEMOGLOBIN: HGB: 12.3 g/dL (ref 12.0–16.0)

## 2014-04-27 ENCOUNTER — Ambulatory Visit
Admit: 2014-04-27 | Disposition: A | Discharge status: Home / Self Care | Payer: Self-pay | Attending: Internal Medicine | Admitting: Internal Medicine

## 2014-04-27 DIAGNOSIS — D509 Iron deficiency anemia, unspecified: Secondary | ICD-10-CM | POA: Diagnosis not present

## 2014-04-27 DIAGNOSIS — Z79899 Other long term (current) drug therapy: Secondary | ICD-10-CM | POA: Diagnosis not present

## 2014-05-16 DIAGNOSIS — Z7901 Long term (current) use of anticoagulants: Secondary | ICD-10-CM | POA: Diagnosis not present

## 2014-05-30 DIAGNOSIS — Z7901 Long term (current) use of anticoagulants: Secondary | ICD-10-CM | POA: Diagnosis not present

## 2014-06-05 DIAGNOSIS — M810 Age-related osteoporosis without current pathological fracture: Secondary | ICD-10-CM | POA: Diagnosis not present

## 2014-06-05 DIAGNOSIS — M0579 Rheumatoid arthritis with rheumatoid factor of multiple sites without organ or systems involvement: Secondary | ICD-10-CM | POA: Diagnosis not present

## 2014-06-05 DIAGNOSIS — D509 Iron deficiency anemia, unspecified: Secondary | ICD-10-CM | POA: Diagnosis not present

## 2014-06-19 DIAGNOSIS — I10 Essential (primary) hypertension: Secondary | ICD-10-CM | POA: Diagnosis not present

## 2014-06-19 DIAGNOSIS — E782 Mixed hyperlipidemia: Secondary | ICD-10-CM | POA: Diagnosis not present

## 2014-06-19 DIAGNOSIS — I480 Paroxysmal atrial fibrillation: Secondary | ICD-10-CM | POA: Diagnosis not present

## 2014-06-19 DIAGNOSIS — I350 Nonrheumatic aortic (valve) stenosis: Secondary | ICD-10-CM | POA: Diagnosis not present

## 2014-06-27 DIAGNOSIS — I480 Paroxysmal atrial fibrillation: Secondary | ICD-10-CM | POA: Diagnosis not present

## 2014-06-28 ENCOUNTER — Other Ambulatory Visit: Payer: Self-pay | Admitting: Family Medicine

## 2014-06-28 ENCOUNTER — Other Ambulatory Visit: Payer: Medicare Other

## 2014-06-28 DIAGNOSIS — Z7901 Long term (current) use of anticoagulants: Secondary | ICD-10-CM

## 2014-06-29 LAB — PROTIME-INR
INR: 1.9 — ABNORMAL HIGH (ref 0.8–1.2)
Prothrombin Time: 20.3 s — ABNORMAL HIGH (ref 9.1–12.0)

## 2014-07-09 ENCOUNTER — Other Ambulatory Visit: Payer: Self-pay | Admitting: Family Medicine

## 2014-07-09 DIAGNOSIS — E785 Hyperlipidemia, unspecified: Secondary | ICD-10-CM

## 2014-07-10 DIAGNOSIS — I482 Chronic atrial fibrillation: Secondary | ICD-10-CM | POA: Diagnosis not present

## 2014-07-10 DIAGNOSIS — I350 Nonrheumatic aortic (valve) stenosis: Secondary | ICD-10-CM | POA: Diagnosis not present

## 2014-07-10 DIAGNOSIS — I6523 Occlusion and stenosis of bilateral carotid arteries: Secondary | ICD-10-CM | POA: Diagnosis not present

## 2014-07-10 DIAGNOSIS — I340 Nonrheumatic mitral (valve) insufficiency: Secondary | ICD-10-CM | POA: Diagnosis not present

## 2014-07-11 ENCOUNTER — Other Ambulatory Visit: Payer: Medicare Other

## 2014-07-11 DIAGNOSIS — Z7901 Long term (current) use of anticoagulants: Secondary | ICD-10-CM

## 2014-07-12 LAB — PROTIME-INR
INR: 2 — ABNORMAL HIGH (ref 0.8–1.2)
PROTHROMBIN TIME: 21.3 s — AB (ref 9.1–12.0)

## 2014-08-08 ENCOUNTER — Other Ambulatory Visit: Payer: Medicare Other

## 2014-08-08 ENCOUNTER — Other Ambulatory Visit
Admission: RE | Admit: 2014-08-08 | Discharge: 2014-08-08 | Disposition: A | Discharge status: Home / Self Care | Payer: Medicare Other | Source: Ambulatory Visit | Attending: Family Medicine | Admitting: Family Medicine

## 2014-08-08 DIAGNOSIS — Z7901 Long term (current) use of anticoagulants: Secondary | ICD-10-CM | POA: Insufficient documentation

## 2014-08-08 LAB — PROTIME-INR
INR: 2.45
PROTHROMBIN TIME: 26.3 s — AB (ref 11.4–15.0)

## 2014-08-16 ENCOUNTER — Other Ambulatory Visit: Payer: Self-pay

## 2014-08-16 ENCOUNTER — Ambulatory Visit: Payer: Self-pay

## 2014-08-16 ENCOUNTER — Other Ambulatory Visit: Payer: Self-pay | Admitting: *Deleted

## 2014-08-16 DIAGNOSIS — D649 Anemia, unspecified: Secondary | ICD-10-CM

## 2014-08-16 DIAGNOSIS — D509 Iron deficiency anemia, unspecified: Secondary | ICD-10-CM

## 2014-08-29 ENCOUNTER — Other Ambulatory Visit: Payer: Self-pay | Admitting: *Deleted

## 2014-08-30 ENCOUNTER — Ambulatory Visit: Payer: Medicare Other | Admitting: Internal Medicine

## 2014-08-30 ENCOUNTER — Other Ambulatory Visit: Payer: Medicare Other

## 2014-09-06 ENCOUNTER — Ambulatory Visit (HOSPITAL_BASED_OUTPATIENT_CLINIC_OR_DEPARTMENT_OTHER): Payer: Medicare Other | Admitting: Internal Medicine

## 2014-09-06 ENCOUNTER — Encounter: Payer: Self-pay | Admitting: Internal Medicine

## 2014-09-06 ENCOUNTER — Inpatient Hospital Stay: Payer: Medicare Other | Attending: Internal Medicine

## 2014-09-06 VITALS — BP 160/82 | HR 54 | Temp 98.7°F | Resp 20 | Ht 63.0 in | Wt 168.7 lb

## 2014-09-06 DIAGNOSIS — I4891 Unspecified atrial fibrillation: Secondary | ICD-10-CM | POA: Insufficient documentation

## 2014-09-06 DIAGNOSIS — M810 Age-related osteoporosis without current pathological fracture: Secondary | ICD-10-CM

## 2014-09-06 DIAGNOSIS — M069 Rheumatoid arthritis, unspecified: Secondary | ICD-10-CM

## 2014-09-06 DIAGNOSIS — I10 Essential (primary) hypertension: Secondary | ICD-10-CM | POA: Diagnosis not present

## 2014-09-06 DIAGNOSIS — D649 Anemia, unspecified: Secondary | ICD-10-CM

## 2014-09-06 DIAGNOSIS — Z79899 Other long term (current) drug therapy: Secondary | ICD-10-CM

## 2014-09-06 DIAGNOSIS — D509 Iron deficiency anemia, unspecified: Secondary | ICD-10-CM | POA: Diagnosis not present

## 2014-09-06 LAB — CBC WITH DIFFERENTIAL/PLATELET
Basophils Absolute: 0.1 10*3/uL (ref 0–0.1)
Basophils Relative: 1 %
Eosinophils Absolute: 0.3 10*3/uL (ref 0–0.7)
Eosinophils Relative: 4 %
HEMATOCRIT: 38.4 % (ref 35.0–47.0)
HEMOGLOBIN: 12.5 g/dL (ref 12.0–16.0)
LYMPHS ABS: 2.1 10*3/uL (ref 1.0–3.6)
LYMPHS PCT: 25 %
MCH: 27.2 pg (ref 26.0–34.0)
MCHC: 32.5 g/dL (ref 32.0–36.0)
MCV: 83.5 fL (ref 80.0–100.0)
MONO ABS: 0.5 10*3/uL (ref 0.2–0.9)
Monocytes Relative: 6 %
Neutro Abs: 5.4 10*3/uL (ref 1.4–6.5)
Neutrophils Relative %: 64 %
Platelets: 225 10*3/uL (ref 150–440)
RBC: 4.6 MIL/uL (ref 3.80–5.20)
RDW: 15.4 % — ABNORMAL HIGH (ref 11.5–14.5)
WBC: 8.3 10*3/uL (ref 3.6–11.0)

## 2014-09-06 LAB — IRON AND TIBC
Iron: 135 ug/dL (ref 28–170)
Saturation Ratios: 31 % (ref 10.4–31.8)
TIBC: 435 ug/dL (ref 250–450)
UIBC: 300 ug/dL

## 2014-09-06 LAB — FERRITIN: Ferritin: 42 ng/mL (ref 11–307)

## 2014-09-06 NOTE — Progress Notes (Signed)
Fatigue is better over last 2 months, no blood in stool or urine

## 2014-09-13 ENCOUNTER — Other Ambulatory Visit: Payer: Medicare Other

## 2014-09-13 DIAGNOSIS — Z7901 Long term (current) use of anticoagulants: Secondary | ICD-10-CM | POA: Diagnosis not present

## 2014-09-14 ENCOUNTER — Other Ambulatory Visit: Payer: Self-pay

## 2014-09-14 DIAGNOSIS — Z7901 Long term (current) use of anticoagulants: Secondary | ICD-10-CM

## 2014-09-14 LAB — PROTIME-INR
INR: 2.3 — ABNORMAL HIGH (ref 0.8–1.2)
Prothrombin Time: 23.7 s — ABNORMAL HIGH (ref 9.1–12.0)

## 2014-09-14 MED ORDER — WARFARIN SODIUM 1 MG PO TABS: 1.0000 mg | ORAL_TABLET | Freq: Every day | ORAL | Status: DC

## 2014-09-20 NOTE — Progress Notes (Signed)
[facility]  [phone] [phone]     ID: Dionicia Abler OB: 1932/03/24  MR#: [id number]  GMW#:[id number]  Patient Care Team: Duanne Limerick, MD as PCP - General (Family Medicine)  CHIEF COMPLAINT/DIAGNOSIS:  Normocytic Anemia - secondary to iron deficiency along with anemia of chronic disease (rheumatoid arthritis). Got parenteral iron therapy with Venofer 300 mg x4 doses in May/June 2015. Stool Hemoccult May 2015 positive x1 and negative x1.  January 2013 - Hemoglobin 9.8, platelets 233, MCV 79, WBC 9100 , LDH 250, EPO 44.6, ab-retic 145, ferritin 8. Random UIEP negative for M-spike. December 2012 - unremarkable B12, folate, serum protein electrophoresis (M-spike negative), serum iron and iron saturation, negative stool Hemoccult x2.   HISTORY OF PRESENT ILLNESS:  Patient returns for hematology followup, she was seen in Aug 2015. She had labs monitored in-between. States that she is clinically doing steady. Chronic fatigue on exertion is unchanged, no new dyspnea, orthopnea or PND. Denies chest pain, headache, dizziness. No new bone pains. No new paresthesias in extremities. Denies bleeding symptoms. Appetite is steady.   REVIEW OF SYSTEMS:   ROS As in HPI above. In addition, no fever, chills or sweats. No new headaches or focal weakness.  No new mood disturbances. No  sore throat, cough, shortness of breath, sputum, hemoptysis or chest pain. No dizziness or palpitation. No abdominal pain, constipation, diarrhea, dysuria or hematuria. No new skin rash or bleeding symptoms. No new paresthesias in extremities.  Otherwise, a complete review of systems is negative.  PAST MEDICAL HISTORY: Reviewed. Past Medical History  Diagnosis Date  . Hypertension   . Arthritis   . Osteoporosis   . Hyperlipidemia   . Atrial fibrillation   . Anemia   . Left rotator cuff tear           Hypertension  Hyperlipidemia  Rheumatoid arthritis (s/p methotrexate,  leflunomide, prednisone, sulfasalazine, remicade, orencia, enbrel)  Osteoporosis  Atrial fibrillation status post ablation  Anemia with history of iron deficiency (refused GI workup)  Appendectomy  Bilateral cataract surgery  And left rotator cuff tear  Bilateral carpal tunnel release.  PAST SURGICAL HISTORY: Reviewed. Past Surgical History  Procedure Laterality Date  . Appendectomy    . Cataract extraction, bilateral    . Bilateral carpal tunnel release      FAMILY HISTORY: Reviewed. Noncontributory, denies hematological disorders or malignancy.  SOCIAL HISTORY: Reviewed. Social History  Substance Use Topics  . Smoking status: Never Smoker   . Smokeless tobacco: Never Used  . Alcohol Use: No    Allergies  Allergen Reactions  . Methotrexate Cough    Other reaction(s): Other (See Comments) Oral ulcers  . Tofacitinib Swelling    Other reaction(s): Other (See Comments) thrush    Current Outpatient Prescriptions  Medication Sig Dispense Refill  . Adalimumab 40 MG/0.8ML PNKT Inject into the skin.    Marland Kitchen alendronate (FOSAMAX) 70 MG tablet TAKE 1 TABLET BY MOUTH ONCE A WEEK    . benazepril (LOTENSIN) 40 MG tablet TAKE 1 TABLET BY MOUTH ONCE A DAY    . metoprolol (LOPRESSOR) 50 MG tablet TAKE 1 TABLET BY MOUTH TWICE A DAY    . predniSONE (DELTASONE) 5 MG tablet TAKE 1 TABLET (5 MG TOTAL) BY MOUTH ONCE DAILY.    Marland Kitchen alendronate (FOSAMAX) 70 MG tablet 1 tablet once a week.    . DHA-EPA-VITAMIN E PO Take by mouth.    Marland Kitchen gemfibrozil (LOPID) 600 MG tablet TAKE 1 TABLET BY MOUTH  ONCE DAILY 30 tablet 2  . warfarin (COUMADIN) 1 MG tablet Take by mouth.    . warfarin (COUMADIN) 1 MG tablet Take 1 tablet (1 mg total) by mouth daily. 30 tablet 5   No current facility-administered medications for this visit.    PHYSICAL EXAM: Filed Vitals:   09/06/14 0942  BP: 160/82  Pulse: 54  Temp: 98.7 F (37.1 C)  Resp: 20     Body mass index is 29.88 kg/(m^2).       GENERAL: Patient is  alert and oriented and in no acute distress. There is no icterus or pallor. HEENT: EOMs intact. No cervical lymphadenopathy. CVS: S1S2, regular LUNGS: Bilaterally clear to auscultation, no rhonchi. ABDOMEN: Soft, nontender. No hepatosplenomegaly clinically.  EXTREMITIES: No pedal edema.   LAB RESULTS: Serum Fe 135, ferritin 42, iron sat 31%, Hb 12.5.    Component Value Date/Time   NA 139 07/12/2013 1114   K 4.5 07/12/2013 1114   CL 107 07/12/2013 1114   CO2 27 07/12/2013 1114   GLUCOSE 89 07/12/2013 1114   BUN 15 07/12/2013 1114   CREATININE 0.42* 07/12/2013 1114   CALCIUM 8.5 07/12/2013 1114   PROT 7.5 07/28/2011 0955   ALBUMIN 3.5 07/28/2011 0955   AST 17 07/28/2011 0955   ALT 20 07/28/2011 0955   ALKPHOS 81 07/28/2011 0955   BILITOT 0.3 07/28/2011 0955   GFRNONAA >60 07/12/2013 1114   GFRNONAA >60 04/30/2011 0816   GFRAA >60 07/12/2013 1114   GFRAA >60 04/30/2011 0816    Lab Results  Component Value Date   WBC 8.3 09/06/2014   NEUTROABS 5.4 09/06/2014   HGB 12.5 09/06/2014   HCT 38.4 09/06/2014   MCV 83.5 09/06/2014   PLT 225 09/06/2014    Lab Results  Component Value Date   IRON 135 09/06/2014     ASSESSMENT / PLAN:   Normocytic Anemia secondary to iron deficiency along with anemia of chronic disease (rheumatoid arthritis)  -  Reviewed labs from today and d/w patient. She is clinically doing steady, no progressive anemia symptoms. Labs done today shows hemoglobin is normal, iron studies remian in normal range. Given this, plan is conitnued monitoring and pursue parenteral iron therapy if she develops iron deficiency in the future. Will monitor hemoglobin and iron studies q16 weeks. Next MD followup at 48 weeks with CBC, iron study and make further plan of management.  In between visits, she was advised to call or come to ER in case of progressive anemia symptoms or acute sickness. Patient is  agreeable to this plan.   Janese Banks, MD   09/20/2014 11:39  AM

## 2014-09-29 ENCOUNTER — Emergency Department: Payer: Medicare Other

## 2014-09-29 ENCOUNTER — Encounter: Payer: Self-pay | Admitting: Emergency Medicine

## 2014-09-29 ENCOUNTER — Inpatient Hospital Stay
Admission: EM | Admit: 2014-09-29 | Discharge: 2014-10-03 | DRG: 469 | Disposition: A | Discharge status: Other Acute Inpatient Hospital | Payer: Medicare Other | Attending: Internal Medicine | Admitting: Internal Medicine

## 2014-09-29 DIAGNOSIS — Z888 Allergy status to other drugs, medicaments and biological substances status: Secondary | ICD-10-CM | POA: Diagnosis not present

## 2014-09-29 DIAGNOSIS — Z7952 Long term (current) use of systemic steroids: Secondary | ICD-10-CM | POA: Diagnosis not present

## 2014-09-29 DIAGNOSIS — Z9841 Cataract extraction status, right eye: Secondary | ICD-10-CM | POA: Diagnosis not present

## 2014-09-29 DIAGNOSIS — Z96649 Presence of unspecified artificial hip joint: Secondary | ICD-10-CM

## 2014-09-29 DIAGNOSIS — K5900 Constipation, unspecified: Secondary | ICD-10-CM | POA: Diagnosis present

## 2014-09-29 DIAGNOSIS — R110 Nausea: Secondary | ICD-10-CM | POA: Diagnosis present

## 2014-09-29 DIAGNOSIS — Z7901 Long term (current) use of anticoagulants: Secondary | ICD-10-CM | POA: Diagnosis not present

## 2014-09-29 DIAGNOSIS — M79605 Pain in left leg: Secondary | ICD-10-CM | POA: Diagnosis not present

## 2014-09-29 DIAGNOSIS — W010XXA Fall on same level from slipping, tripping and stumbling without subsequent striking against object, initial encounter: Secondary | ICD-10-CM | POA: Diagnosis present

## 2014-09-29 DIAGNOSIS — M79604 Pain in right leg: Secondary | ICD-10-CM

## 2014-09-29 DIAGNOSIS — I10 Essential (primary) hypertension: Secondary | ICD-10-CM | POA: Diagnosis present

## 2014-09-29 DIAGNOSIS — S72009A Fracture of unspecified part of neck of unspecified femur, initial encounter for closed fracture: Secondary | ICD-10-CM | POA: Diagnosis present

## 2014-09-29 DIAGNOSIS — M810 Age-related osteoporosis without current pathological fracture: Secondary | ICD-10-CM | POA: Diagnosis present

## 2014-09-29 DIAGNOSIS — M1990 Unspecified osteoarthritis, unspecified site: Secondary | ICD-10-CM | POA: Diagnosis present

## 2014-09-29 DIAGNOSIS — M25559 Pain in unspecified hip: Secondary | ICD-10-CM | POA: Diagnosis not present

## 2014-09-29 DIAGNOSIS — Z9049 Acquired absence of other specified parts of digestive tract: Secondary | ICD-10-CM | POA: Diagnosis present

## 2014-09-29 DIAGNOSIS — I4891 Unspecified atrial fibrillation: Secondary | ICD-10-CM | POA: Diagnosis not present

## 2014-09-29 DIAGNOSIS — S72031A Displaced midcervical fracture of right femur, initial encounter for closed fracture: Secondary | ICD-10-CM | POA: Diagnosis not present

## 2014-09-29 DIAGNOSIS — S72001A Fracture of unspecified part of neck of right femur, initial encounter for closed fracture: Principal | ICD-10-CM | POA: Diagnosis present

## 2014-09-29 DIAGNOSIS — I480 Paroxysmal atrial fibrillation: Secondary | ICD-10-CM | POA: Diagnosis not present

## 2014-09-29 DIAGNOSIS — Z79899 Other long term (current) drug therapy: Secondary | ICD-10-CM

## 2014-09-29 DIAGNOSIS — I350 Nonrheumatic aortic (valve) stenosis: Secondary | ICD-10-CM | POA: Diagnosis present

## 2014-09-29 DIAGNOSIS — S72011A Unspecified intracapsular fracture of right femur, initial encounter for closed fracture: Secondary | ICD-10-CM | POA: Diagnosis not present

## 2014-09-29 DIAGNOSIS — Z8249 Family history of ischemic heart disease and other diseases of the circulatory system: Secondary | ICD-10-CM

## 2014-09-29 DIAGNOSIS — M25551 Pain in right hip: Secondary | ICD-10-CM | POA: Diagnosis not present

## 2014-09-29 DIAGNOSIS — Z471 Aftercare following joint replacement surgery: Secondary | ICD-10-CM | POA: Diagnosis not present

## 2014-09-29 DIAGNOSIS — Z9842 Cataract extraction status, left eye: Secondary | ICD-10-CM | POA: Diagnosis not present

## 2014-09-29 DIAGNOSIS — N3000 Acute cystitis without hematuria: Secondary | ICD-10-CM | POA: Diagnosis present

## 2014-09-29 DIAGNOSIS — S72091A Other fracture of head and neck of right femur, initial encounter for closed fracture: Secondary | ICD-10-CM | POA: Diagnosis not present

## 2014-09-29 DIAGNOSIS — Z96641 Presence of right artificial hip joint: Secondary | ICD-10-CM | POA: Diagnosis not present

## 2014-09-29 DIAGNOSIS — I214 Non-ST elevation (NSTEMI) myocardial infarction: Secondary | ICD-10-CM | POA: Diagnosis not present

## 2014-09-29 DIAGNOSIS — E785 Hyperlipidemia, unspecified: Secondary | ICD-10-CM | POA: Diagnosis present

## 2014-09-29 DIAGNOSIS — M069 Rheumatoid arthritis, unspecified: Secondary | ICD-10-CM | POA: Diagnosis not present

## 2014-09-29 DIAGNOSIS — R001 Bradycardia, unspecified: Secondary | ICD-10-CM | POA: Diagnosis not present

## 2014-09-29 DIAGNOSIS — Z01818 Encounter for other preprocedural examination: Secondary | ICD-10-CM | POA: Diagnosis not present

## 2014-09-29 DIAGNOSIS — I482 Chronic atrial fibrillation: Secondary | ICD-10-CM | POA: Diagnosis present

## 2014-09-29 DIAGNOSIS — D649 Anemia, unspecified: Secondary | ICD-10-CM | POA: Diagnosis not present

## 2014-09-29 DIAGNOSIS — W19XXXA Unspecified fall, initial encounter: Secondary | ICD-10-CM | POA: Diagnosis not present

## 2014-09-29 DIAGNOSIS — M25552 Pain in left hip: Secondary | ICD-10-CM | POA: Diagnosis not present

## 2014-09-29 LAB — CBC
HEMATOCRIT: 36.4 % (ref 35.0–47.0)
HEMOGLOBIN: 11.5 g/dL — AB (ref 12.0–16.0)
MCH: 26.2 pg (ref 26.0–34.0)
MCHC: 31.7 g/dL — ABNORMAL LOW (ref 32.0–36.0)
MCV: 82.5 fL (ref 80.0–100.0)
Platelets: 252 10*3/uL (ref 150–440)
RBC: 4.41 MIL/uL (ref 3.80–5.20)
RDW: 15.3 % — AB (ref 11.5–14.5)
WBC: 9.5 10*3/uL (ref 3.6–11.0)

## 2014-09-29 LAB — BASIC METABOLIC PANEL
ANION GAP: 6 (ref 5–15)
BUN: 18 mg/dL (ref 6–20)
CALCIUM: 8.1 mg/dL — AB (ref 8.9–10.3)
CHLORIDE: 108 mmol/L (ref 101–111)
CO2: 25 mmol/L (ref 22–32)
Creatinine, Ser: 0.58 mg/dL (ref 0.44–1.00)
GFR calc non Af Amer: >60 mL/min (ref >60)
Glucose, Bld: 98 mg/dL (ref 65–99)
Potassium: 3.8 mmol/L (ref 3.5–5.1)
Sodium: 139 mmol/L (ref 135–145)

## 2014-09-29 LAB — PROTIME-INR
INR: 2.3
INR: 1.78
PROTHROMBIN TIME: 25.4 s — AB (ref 11.4–15.0)
PROTHROMBIN TIME: 20.9 s — AB (ref 11.4–15.0)

## 2014-09-29 LAB — URINALYSIS COMPLETE WITH MICROSCOPIC (ARMC ONLY)
BILIRUBIN URINE: NEGATIVE
GLUCOSE, UA: NEGATIVE mg/dL
KETONES UR: NEGATIVE mg/dL
NITRITE: NEGATIVE
Protein, ur: 30 mg/dL — AB
SPECIFIC GRAVITY, URINE: 1.014 (ref 1.005–1.030)
pH: 6 (ref 5.0–8.0)

## 2014-09-29 MED ORDER — PREDNISONE 5 MG PO TABS
5.0000 mg | ORAL_TABLET | Freq: Every day | ORAL | Status: DC
  Administered 2014-10-01 – 2014-10-03 (×3): 5 mg via ORAL
  Filled 2014-09-29 (×3): qty 1

## 2014-09-29 MED ORDER — ACETAMINOPHEN 650 MG RE SUPP: 650.0000 mg | Freq: Four times a day (QID) | RECTAL | Status: DC | PRN

## 2014-09-29 MED ORDER — ONDANSETRON HCL 4 MG/2ML IJ SOLN
4.0000 mg | Freq: Four times a day (QID) | INTRAMUSCULAR | Status: DC | PRN
  Administered 2014-09-29 – 2014-09-30 (×2): 4 mg via INTRAVENOUS
  Filled 2014-09-29 (×2): qty 2

## 2014-09-29 MED ORDER — METOPROLOL TARTRATE 50 MG PO TABS
50.0000 mg | ORAL_TABLET | Freq: Two times a day (BID) | ORAL | Status: DC
  Administered 2014-09-29 – 2014-09-30 (×2): 50 mg via ORAL
  Filled 2014-09-29 (×2): qty 1

## 2014-09-29 MED ORDER — ONDANSETRON HCL 4 MG/2ML IJ SOLN
4.0000 mg | Freq: Once | INTRAMUSCULAR | Status: AC
  Administered 2014-09-29: 4 mg via INTRAVENOUS
  Filled 2014-09-29: qty 2

## 2014-09-29 MED ORDER — HYDROCODONE-ACETAMINOPHEN 5-325 MG PO TABS
1.0000 | ORAL_TABLET | ORAL | Status: DC | PRN
  Administered 2014-09-29 (×2): 1 via ORAL
  Filled 2014-09-29 (×2): qty 1

## 2014-09-29 MED ORDER — GEMFIBROZIL 600 MG PO TABS
600.0000 mg | ORAL_TABLET | Freq: Every day | ORAL | Status: DC
  Administered 2014-09-30 – 2014-10-03 (×4): 600 mg via ORAL
  Filled 2014-09-29 (×4): qty 1

## 2014-09-29 MED ORDER — PHENAZOPYRIDINE HCL 100 MG PO TABS
100.0000 mg | ORAL_TABLET | Freq: Three times a day (TID) | ORAL | Status: DC
  Administered 2014-09-29 – 2014-10-03 (×11): 100 mg via ORAL
  Filled 2014-09-29 (×11): qty 1

## 2014-09-29 MED ORDER — ALUM & MAG HYDROXIDE-SIMETH 200-200-20 MG/5ML PO SUSP: 30.0000 mL | Freq: Four times a day (QID) | ORAL | Status: DC | PRN

## 2014-09-29 MED ORDER — ACETAMINOPHEN 500 MG PO TABS
1000.0000 mg | ORAL_TABLET | ORAL | Status: AC
  Administered 2014-09-29: 1000 mg via ORAL
  Filled 2014-09-29: qty 2

## 2014-09-29 MED ORDER — VITAMIN K1 10 MG/ML IJ SOLN
1.0000 mg | Freq: Once | INTRAMUSCULAR | Status: AC
  Administered 2014-09-29: 1 mg via INTRAVENOUS
  Filled 2014-09-29: qty 0.1

## 2014-09-29 MED ORDER — HYDROMORPHONE HCL 1 MG/ML IJ SOLN
0.5000 mg | INTRAMUSCULAR | Status: DC | PRN
  Administered 2014-09-30 (×3): 0.5 mg via INTRAVENOUS
  Filled 2014-09-29 (×3): qty 1

## 2014-09-29 MED ORDER — DEXTROSE 5 % IV SOLN
1.0000 g | INTRAVENOUS | Status: DC
  Administered 2014-09-29 – 2014-10-01 (×3): 1 g via INTRAVENOUS
  Filled 2014-09-29 (×4): qty 10

## 2014-09-29 MED ORDER — HYDRALAZINE HCL 20 MG/ML IJ SOLN: 10.0000 mg | INTRAMUSCULAR | Status: DC | PRN

## 2014-09-29 MED ORDER — IBUPROFEN 600 MG PO TABS
600.0000 mg | ORAL_TABLET | ORAL | Status: AC
  Administered 2014-09-29: 600 mg via ORAL
  Filled 2014-09-29: qty 1

## 2014-09-29 MED ORDER — ONDANSETRON HCL 4 MG PO TABS: 4.0000 mg | ORAL_TABLET | Freq: Four times a day (QID) | ORAL | Status: DC | PRN

## 2014-09-29 MED ORDER — PHYTONADIONE 5 MG PO TABS
2.5000 mg | ORAL_TABLET | Freq: Once | ORAL | Status: DC
  Filled 2014-09-29: qty 1

## 2014-09-29 MED ORDER — BENAZEPRIL HCL 20 MG PO TABS
40.0000 mg | ORAL_TABLET | Freq: Every day | ORAL | Status: DC
  Administered 2014-09-30 – 2014-10-03 (×4): 40 mg via ORAL
  Filled 2014-09-29 (×4): qty 2

## 2014-09-29 MED ORDER — SENNOSIDES-DOCUSATE SODIUM 8.6-50 MG PO TABS: 1.0000 | ORAL_TABLET | Freq: Every evening | ORAL | Status: DC | PRN

## 2014-09-29 MED ORDER — CEFAZOLIN SODIUM-DEXTROSE 2-3 GM-% IV SOLR
2.0000 g | INTRAVENOUS | Status: AC
  Administered 2014-09-30: 2 g via INTRAVENOUS
  Filled 2014-09-29: qty 50

## 2014-09-29 MED ORDER — ACETAMINOPHEN 325 MG PO TABS: 650.0000 mg | ORAL_TABLET | Freq: Four times a day (QID) | ORAL | Status: DC | PRN

## 2014-09-29 MED ORDER — BISACODYL 5 MG PO TBEC: 5.0000 mg | DELAYED_RELEASE_TABLET | Freq: Every day | ORAL | Status: DC | PRN

## 2014-09-29 MED ORDER — HYDRALAZINE HCL 20 MG/ML IJ SOLN
10.0000 mg | Freq: Four times a day (QID) | INTRAMUSCULAR | Status: DC | PRN
  Administered 2014-09-29 – 2014-10-01 (×2): 10 mg via INTRAVENOUS
  Filled 2014-09-29 (×2): qty 1

## 2014-09-29 NOTE — Progress Notes (Signed)
Dr Joice Lofts called back to give paramaters for PT/inr results at 2300.  He has requested that the Vitamin k 1mg  in a 48ml bag of NS be repeated if the INR is 1.6 or higher.  Dr 45m was notified of this and said for me to discontinue the Vitamin 2.5 mg oral that she had ordered

## 2014-09-29 NOTE — Progress Notes (Signed)
verbak order for pain med given by Poggi MD

## 2014-09-29 NOTE — Progress Notes (Signed)
Sister of the patient wanted to know when dr Joice Lofts might be coming.  MD called back and said he plans to come in the am.  Order given for vitamin K and labs at 2300.  Dr Joice Lofts wants to be called with lab results

## 2014-09-29 NOTE — ED Notes (Signed)
Pt soiled chuck pads under her. Changed soiled pads with assistance from [employer].

## 2014-09-29 NOTE — ED Provider Notes (Signed)
[facility] Emergency Department Provider Note REMINDER - THIS NOTE IS NOT A FINAL MEDICAL RECORD UNTIL IT IS SIGNED. UNTIL THEN, THE CONTENT BELOW MAY REFLECT INFORMATION FROM A DOCUMENTATION TEMPLATE, NOT THE ACTUAL PATIENT VISIT. ____________________________________________  Time seen: Approximately 3:03 PM  I have reviewed the triage vital signs and the nursing notes.   HISTORY  Chief Complaint Fall and Hip Injury    HPI Sonia Thompson is a 81 y.o. female who presents after tripping in her walker. She reports she got caught up in a rug in the bathroom. She fell forward and landed on her right side is been having pain in her right leg mid thigh and upper leg since.  Patient denies any fevers or chills. No chest pain or trouble breathing. She does notice that 2 weeks ago she fell in her walker as well, and injured her left leg but is in smell to walk on it though it remains "sore" at times.  No numbness or tingling.  No previous history of hip surgery.  Denies injury to the ankles, feet or lower legs. No back pain. Did not strike her head. No neck pain.   Past Medical History  Diagnosis Date  . Hypertension   . Arthritis   . Osteoporosis   . Hyperlipidemia   . Atrial fibrillation   . Anemia   . Left rotator cuff tear     There are no active problems to display for this patient.   Past Surgical History  Procedure Laterality Date  . Appendectomy    . Cataract extraction, bilateral    . Bilateral carpal tunnel release      Current Outpatient Rx  Name  Route  Sig  Dispense  Refill  . Adalimumab 40 MG/0.8ML PNKT   Subcutaneous   Inject 40 mg into the skin every 14 (fourteen) days.          Marland Kitchen alendronate (FOSAMAX) 70 MG tablet      1 tablet once a week. Take on Saturdays.         . benazepril (LOTENSIN) 40 MG tablet      TAKE 1 TABLET BY MOUTH ONCE A DAY         . esomeprazole (NEXIUM) 20 MG capsule   Oral   Take 20 mg  by mouth daily at 12 noon.         Marland Kitchen gemfibrozil (LOPID) 600 MG tablet      TAKE 1 TABLET BY MOUTH ONCE DAILY   30 tablet   2   . metoprolol (LOPRESSOR) 50 MG tablet      TAKE 1 TABLET BY MOUTH TWICE A DAY         . Omega-3 Fatty Acids (FISH OIL) 1000 MG CAPS   Oral   Take 1,000 mg by mouth daily.         . predniSONE (DELTASONE) 5 MG tablet      TAKE 1 TABLET (5 MG TOTAL) BY MOUTH ONCE DAILY.         Marland Kitchen warfarin (COUMADIN) 1 MG tablet   Oral   Take 1 mg by mouth every Monday, Wednesday, and Friday. Take along with 6mg  tablet to equal 7mg  total.         . warfarin (COUMADIN) 6 MG tablet   Oral   Take 6 mg by mouth daily.         warfarin (COUMADIN) 1 MG tablet   Oral   Take 1 tablet (  1 mg total) by mouth daily.   30 tablet   5     Allergies Methotrexate and Tofacitinib  History reviewed. No pertinent family history.  Social History Social History  Substance Use Topics  . Smoking status: Never Smoker   . Smokeless tobacco: Never Used  . Alcohol Use: No    Review of Systems Constitutional: No fever/chills Eyes: No visual changes. ENT: No sore throat. Cardiovascular: Denies chest pain. Respiratory: Denies shortness of breath. Gastrointestinal: No abdominal pain.  No nausea, no vomiting.  No diarrhea.  No constipation. Genitourinary: Negative for dysuria. Musculoskeletal: Negative for back pain. See history of present illness Skin: Negative for rash. Neurological: Negative for headaches, focal weakness or numbness.  10-point ROS otherwise negative.  ____________________________________________   PHYSICAL EXAM:  VITAL SIGNS: ED Triage Vitals  Enc Vitals Group     BP 09/29/14 1330 206/131 mmHg     Pulse Rate 09/29/14 1330 61     Resp 09/29/14 1333 20     Temp 09/29/14 1333 98.2 F (36.8 C)     Temp Source 09/29/14 1333 Oral     SpO2 09/29/14 1330 93 %     Weight 09/29/14 1333 160 lb (72.576 kg)     Height 09/29/14 1333 5' 3.5"  (1.613 m)     Head Cir --      Peak Flow --      Pain Score 09/29/14 1333 10     Pain Loc --      Pain Edu? --      Excl. in GC? --    Constitutional: Alert and oriented. Well appearing and in no acute distress. Eyes: Conjunctivae are normal. PERRL. EOMI. Head: Atraumatic. Nose: No congestion/rhinnorhea. Mouth/Throat: Mucous membranes are moist.  Oropharynx non-erythematous. Neck: No stridor.   Cardiovascular: Irregular rate, regular rhythm. Grossly normal heart sounds.  Good peripheral circulation. Respiratory: Normal respiratory effort.  No retractions. Lungs CTAB. Gastrointestinal: Soft and nontender. No distention. No abdominal bruits. No CVA tenderness. Musculoskeletal: No lower extremity tenderness nor edema except for moderate tenderness along the right mid thigh and proximal thigh. No obvious shortening  No joint effusions. Strong and normal dorsalis pedis pulses. Normal sensation lower extremities. No pain in the knee or ankle. Neurologic:  Normal speech and language. No gross focal neurologic deficits are appreciated. No gait instability. Skin:  Skin is warm, dry and intact. No rash noted. Psychiatric: Mood and affect are normal. Speech and behavior are normal.  ____________________________________________   LABS (all labs ordered are listed, but only abnormal results are displayed)  Labs Reviewed  CBC  BASIC METABOLIC PANEL  URINALYSIS COMPLETEWITH MICROSCOPIC ([facility] ONLY)  PROTIME-INR   ____________________________________________  EKG  Reviewed and interpreted by me Sinus bradycardia, no ischemic changes. Slight criteria for LVH. QRS 80 QTc 420 Ventricular rate 55 ____________________________________________  RADIOLOGY  DG Chest 1 View (Final result) Result time: 09/29/14 15:03:18   Final result by Rad Results In Interface (09/29/14 15:03:18)   Narrative:   CLINICAL DATA: Two falls within the last week. Bilateral hip pain. History of hypertension,  atrial fibrillation.  EXAM: CHEST 1 VIEW  COMPARISON: 12/15/2013  FINDINGS: Heart is mildly enlarged. The lungs are free of focal consolidations and pleural effusions. Mild perihilar peribronchial thickening. No pulmonary edema. No pneumothorax or acute displaced fractures. Deformity of the right proximal humerus is consistent with old fracture.  IMPRESSION: 1. Cardiomegaly without pulmonary edema. 2. Bronchitic changes. No focal acute pulmonary abnormality.   Electronically Signed By: Norva Pavlov  M.D. On: 09/29/2014 15:03          DG FEMUR, MIN 2 VIEWS RIGHT (Final result) Result time: 09/29/14 14:47:26   Final result by Rad Results In Interface (09/29/14 14:47:26)   Narrative:   CLINICAL DATA: Patient from home. Larey Seat this morning after tripping over her walker. Shortening of the right leg.  EXAM: RIGHT FEMUR 2 VIEWS  COMPARISON: None.  FINDINGS: There is an acute impacted fracture of the right subcapital femoral neck, associated with varus angulation and foreshortening of femur. No dislocation. Distal femur is intact.  IMPRESSION: Subcapital femoral neck fracture.   Electronically Signed By: Norva Pavlov M.D. On: 09/29/2014 14:47          DG FEMUR MIN 2 VIEWS LEFT (Final result) Result time: 09/29/14 14:48:08   Final result by Rad Results In Interface (09/29/14 14:48:08)   Narrative:   CLINICAL DATA: Acute fall today with left leg pain. Initial encounter.  EXAM: LEFT FEMUR 2 VIEWS  COMPARISON: None.  FINDINGS: There is no evidence of acute fracture, subluxation or dislocation.  Mild degenerative changes within the hip and knee noted.  No focal bony lesions are identified.  IMPRESSION: No evidence of acute bony abnormality.   Electronically Signed By: Harmon Pier M.D. On: 09/29/2014 14:48          DG Pelvis 1-2 Views (Final result) Result time: 09/29/14 14:49:22   Final result by Rad Results  In Interface (09/29/14 14:49:22)   Narrative:   CLINICAL DATA: Fall today with acute right hip pain. Initial encounter.  EXAM: PELVIS - 1-2 VIEW  COMPARISON: None.  FINDINGS: A right femoral neck fracture is identified without subluxation or dislocation.  Diffuse osteopenia is noted.  Degenerative changes in the lower lumbar spine are present.  No suspicious focal bony lesions are noted.  IMPRESSION: Right femoral neck fracture.      ____________________________________________   PROCEDURES  Procedure(s) performed: None  Critical Care performed: No  ____________________________________________   INITIAL IMPRESSION / ASSESSMENT AND PLAN / ED COURSE  Pertinent labs & imaging results that were available during my care of the patient were reviewed by me and considered in my medical decision making (see chart for details).  Patient presents mechanical fall. Patient x-rays reveal right femoral neck fracture. Discussed with orthopedics Dr. Bonnita Hollow. We'll admit the patient for ongoing care to the hospitalist service including follow-up on basic labs. ____________________________________________   FINAL CLINICAL IMPRESSION(S) / ED DIAGNOSES  Final diagnoses:  Bilateral leg pain      Sharyn Creamer, MD 09/29/14 [phone]

## 2014-09-29 NOTE — ED Notes (Signed)
Per EMS pt is from home. Pt fell this morning after tripping over her walker but pt was able to get herself up and walk to the bathroom. Pt received of Fentanyl en route and a 20 in her L AC placed by EMS. Pt c/o bilateral hip pain, per EMS pt has shortening and rotation of R Leg.

## 2014-09-29 NOTE — Progress Notes (Signed)
Contacted Mody MD order for zofran 4 mg iv x 1 for pt continued c/o nausea

## 2014-09-29 NOTE — ED Notes (Signed)
Pt taken to Xray.

## 2014-09-29 NOTE — H&P (Addendum)
[facility] - [place] at [facility]   PATIENT NAME: Sonia Thompson    MR#:  [id number]  DATE OF BIRTH:  [birth year]  DATE OF ADMISSION:  09/29/2014  PRIMARY CARE PHYSICIAN: Elizabeth Sauer, MD   REQUESTING/REFERRING PHYSICIAN: Dr Fanny Bien  CHIEF COMPLAINT:  Fall and hip pain HISTORY OF PRESENT ILLNESS:  Sonia Thompson  is a 81 y.o. female with a known history of rheumatoid arthritis, atrial fibrillation on Coumadin and essential hypertension who presents with above complaint. Patient was at the refrigerator this morning using her walker. She turned around and unfortunately suffered a mechanical fall on the right side. She came to the ER for further evaluation as she was having right hip pain. In the emergency room it does appear that she has a hip fracture. Patient denies chest pain, shortness of breath, loss of consciousness. She is able to perform her ADLs. She has been using a walker due to left sided pain.  PAST MEDICAL HISTORY:   Past Medical History  Diagnosis Date  . Hypertension   . Arthritis   . Osteoporosis   . Hyperlipidemia   . Atrial fibrillation   . Anemia   . Left rotator cuff tear     PAST SURGICAL HISTORY:   Past Surgical History  Procedure Laterality Date  . Appendectomy    . Cataract extraction, bilateral    . Bilateral carpal tunnel release      SOCIAL HISTORY:   Social History  Substance Use Topics  . Smoking status: Never Smoker   . Smokeless tobacco: Never Used  . Alcohol Use: No    FAMILY HISTORY:  Positive CAD  DRUG ALLERGIES:   Allergies  Allergen Reactions  . Methotrexate Cough    Other reaction(s): Other (See Comments) Oral ulcers  . Tofacitinib Swelling    Other reaction(s): Other (See Comments) thrush     REVIEW OF SYSTEMS:  CONSTITUTIONAL: No fever, fatigue or weakness.  EYES: No blurred or double vision.  EARS, NOSE, AND THROAT: No tinnitus or ear pain.  RESPIRATORY: No cough, shortness of breath,  wheezing or hemoptysis.  CARDIOVASCULAR: No chest pain, orthopnea, edema.  GASTROINTESTINAL: No nausea, vomiting, diarrhea or abdominal pain.  GENITOURINARY: No dysuria, hematuria.  ENDOCRINE: No polyuria, nocturia,  HEMATOLOGY: No anemia, easy bruising or bleeding SKIN: No rash or lesion. MUSCULOSKELETAL: Positive rheumatoid arthritis  NEUROLOGIC: No tingling, numbness, weakness.  PSYCHIATRY: No anxiety or depression.   MEDICATIONS AT HOME:   Prior to Admission medications   Medication Sig Start Date End Date Taking? Authorizing Provider  Adalimumab 40 MG/0.8ML PNKT Inject 40 mg into the skin every 14 (fourteen) days.  06/07/14  Yes Historical Provider, MD  alendronate (FOSAMAX) 70 MG tablet 1 tablet once a week. Take on Saturdays. 12/25/13  Yes Historical Provider, MD  benazepril (LOTENSIN) 40 MG tablet TAKE 1 TABLET BY MOUTH ONCE A DAY 01/31/14  Yes Historical Provider, MD  esomeprazole (NEXIUM) 20 MG capsule Take 20 mg by mouth daily at 12 noon.   Yes Historical Provider, MD  gemfibrozil (LOPID) 600 MG tablet TAKE 1 TABLET BY MOUTH ONCE DAILY 07/09/14  Yes Duanne Limerick, MD  metoprolol (LOPRESSOR) 50 MG tablet TAKE 1 TABLET BY MOUTH TWICE A DAY 08/21/14  Yes Historical Provider, MD  Omega-3 Fatty Acids (FISH OIL) 1000 MG CAPS Take 1,000 mg by mouth daily.   Yes Historical Provider, MD  predniSONE (DELTASONE) 5 MG tablet TAKE 1 TABLET (5 MG TOTAL) BY MOUTH ONCE DAILY. 03/08/14  Yes Historical Provider, MD  warfarin (COUMADIN) 1 MG tablet Take 1 mg by mouth every Monday, Wednesday, and Friday. Take along with  tablet to equal  total.   Yes Historical Provider, MD  warfarin (COUMADIN) 6 MG tablet Take 6 mg by mouth daily.   Yes Historical Provider, MD  warfarin (COUMADIN) 1 MG tablet Take 1 tablet (1 mg total) by mouth daily. 09/14/14   Duanne Limerick, MD      VITAL SIGNS:  Blood pressure 178/90, pulse 54, temperature 98.2 F (36.8 C), temperature source Oral, resp. rate 20, height 5'  3.5" (1.613 m), weight 72.576 kg (160 lb), SpO2 92 %.  PHYSICAL EXAMINATION:  GENERAL:  82 y.o.-year-old patient lying in the bed with no acute distress.  EYES: Pupils equal, round, reactive to light and accommodation. No scleral icterus. Extraocular muscles intact.  HEENT: Head atraumatic, normocephalic. Oropharynx and nasopharynx clear.  NECK:  Supple, no jugular venous distention. No thyroid enlargement, no tenderness.  LUNGS: Normal breath sounds bilaterally, no wheezing, rales,rhonchi or crepitation. No use of accessory muscles of respiration.  CARDIOVASCULAR: S1, S2 normal. No murmurs, rubs, or gallops.  ABDOMEN: Soft, nontender, nondistended. Bowel sounds present. No organomegaly or mass.  EXTREMITIES: No pedal edema, cyanosis, or clubbing. Tenderness right thigh and hip NEUROLOGIC: Cranial nerves II through XII are grossly intact. No focal deficits. PSYCHIATRIC: The patient is alert and oriented x 3.  SKIN: No obvious rash, lesion, or ulcer.   LABORATORY PANEL:   CBC  Recent Labs Lab 09/29/14 1455  WBC 9.5  HGB 11.5*  HCT 36.4  PLT 252   ------------------------------------------------------------------------------------------------------------------  Chemistries   Recent Labs Lab 09/29/14 1455  NA 139  K 3.8  CL 108  CO2 25  GLUCOSE 98  BUN 18  CREATININE 0.58  CALCIUM 8.1*   ------------------------------------------------------------------------------------------------------------------  Cardiac Enzymes No results for input(s): TROPONINI in the last 168 hours. ------------------------------------------------------------------------------------------------------------------  RADIOLOGY:  Dg Chest 1 View  09/29/2014   CLINICAL DATA:  Two falls within the last week. Bilateral hip pain. History of hypertension, atrial fibrillation.  EXAM: CHEST  1 VIEW  COMPARISON:  12/15/2013  FINDINGS: Heart is mildly enlarged. The lungs are free of focal consolidations and  pleural effusions. Mild perihilar peribronchial thickening. No pulmonary edema. No pneumothorax or acute displaced fractures. Deformity of the right proximal humerus is consistent with old fracture.  IMPRESSION: 1. Cardiomegaly without pulmonary edema. 2. Bronchitic changes.  No focal acute pulmonary abnormality.   Electronically Signed   By: Norva Pavlov M.D.   On: 09/29/2014 15:03   Dg Pelvis 1-2 Views  09/29/2014   CLINICAL DATA:  Fall today with acute right hip pain. Initial encounter.  EXAM: PELVIS - 1-2 VIEW  COMPARISON:  None.  FINDINGS: A right femoral neck fracture is identified without subluxation or dislocation.  Diffuse osteopenia is noted.  Degenerative changes in the lower lumbar spine are present.  No suspicious focal bony lesions are noted.  IMPRESSION: Right femoral neck fracture.   Electronically Signed   By: Harmon Pier M.D.   On: 09/29/2014 14:49   Dg Femur Min 2 Views Left  09/29/2014   CLINICAL DATA:  Acute fall today with left leg pain. Initial encounter.  EXAM: LEFT FEMUR 2 VIEWS  COMPARISON:  None.  FINDINGS: There is no evidence of acute fracture, subluxation or dislocation.  Mild degenerative changes within the hip and knee noted.  No focal bony lesions are identified.  IMPRESSION: No evidence of acute bony abnormality.  Electronically Signed   By: Harmon Pier M.D.   On: 09/29/2014 14:48   Dg Femur, Min 2 Views Right  09/29/2014   CLINICAL DATA:  Patient from home. Larey Seat this morning after tripping over her walker. Shortening of the right leg.  EXAM: RIGHT FEMUR 2 VIEWS  COMPARISON:  None.  FINDINGS: There is an acute impacted fracture of the right subcapital femoral neck, associated with varus angulation and foreshortening of femur. No dislocation. Distal femur is intact.  IMPRESSION: Subcapital femoral neck fracture.   Electronically Signed   By: Norva Pavlov M.D.   On: 09/29/2014 14:47    EKG:  Sinus bradycardia no ST elevation depression  IMPRESSION AND PLAN:  This  is a very pleasant 81 year old female with a history of atrial fibrillation on Coumadin, essential hypertension and rheumatoid arthritis who presents after mechanical fall and unfortunately has suffered a acute impacted fracture of the right subcapital femoral neck.  1. Femoral neck fracture: Patient's preoperative risk is moderate risk for moderate procedure. She may proceed to surgery without further cardiac workup. Patient's INR is elevated and therefore vitamin K 2.5 mg by mouth will be given 1. INR will be checked tomorrow. Depending on INR level patient may proceed to surgery tomorrow. DVT prophylaxis as per orthopedic surgery.  2. Atrial fibrillation: Patient is on Coumadin. Coumadin is being held as explained above. Vitamin K has been given. INR 4 AM. Rate is adequately controlled at this time. She needs to continue preoperatively and postoperatively on metoprolol.  3. Essential hypertension: Patient's blood pressure is elevated due to pain. Patient will continue on Benzapril and the Toprol. Hydralazine when necessary has been ordered as well. Continue monitor blood pressure.  4. Rheumatoid arthritis: Patient is on daily steroids. We will continue prednisone 5 mg daily.  5. UTI: start Rocephin and I will order Urine culture.  All the records are reviewed and case discussed with ED provider. Management plans discussed with the patient and she is in agreement.  CODE STATUS: FULL  TOTAL TIME TAKING CARE OF THIS PATIENT: 45 minutes.    Ousmane Seeman M.D on 09/29/2014 at 3:50 PM  Between 7am to 6pm - Pager - [phone] After 6pm go to [email or web address] - password [email or web address] [facility]  [place]  [facility]  Office  [phone]  CC: Primary care physician; Elizabeth Sauer, MD

## 2014-09-29 NOTE — Progress Notes (Signed)
[facility] Physicians - [place] at [facility]   PATIENT NAME: Sonia Thompson    MR#:  [id number]  DATE OF BIRTH:  September 16, 1932  DATE OF ADMISSION:  09/29/2014  PRIMARY CARE PHYSICIAN: Elizabeth Sauer, MD   REQUESTING/REFERRING PHYSICIAN: Dr Fanny Bien  CHIEF COMPLAINT:  Fall and hip pain HISTORY OF PRESENT ILLNESS:  Sonia Thompson  is a 81 y.o. female with a known history of rheumatoid arthritis, atrial fibrillation on Coumadin and essential hypertension who presents with above complaint. Patient was at the refrigerator this morning using her walker. She turned around and unfortunately suffered a mechanical fall on the right side. She came to the ER for further evaluation as she was having right hip pain. In the emergency room it does appear that she has a hip fracture. Patient denies chest pain, shortness of breath, loss of consciousness. She is able to perform her ADLs. She has been using a walker due to left sided pain.  PAST MEDICAL HISTORY:   Past Medical History  Diagnosis Date  . Hypertension   . Arthritis   . Osteoporosis   . Hyperlipidemia   . Atrial fibrillation   . Anemia   . Left rotator cuff tear     PAST SURGICAL HISTORY:   Past Surgical History  Procedure Laterality Date  . Appendectomy    . Cataract extraction, bilateral    . Bilateral carpal tunnel release      SOCIAL HISTORY:   Social History  Substance Use Topics  . Smoking status: Never Smoker   . Smokeless tobacco: Never Used  . Alcohol Use: No    FAMILY HISTORY:  Positive CAD  DRUG ALLERGIES:   Allergies  Allergen Reactions  . Methotrexate Cough    Other reaction(s): Other (See Comments) Oral ulcers  . Tofacitinib Swelling    Other reaction(s): Other (See Comments) thrush     REVIEW OF SYSTEMS:  CONSTITUTIONAL: No fever, fatigue or weakness.  EYES: No blurred or double vision.  EARS, NOSE, AND THROAT: No tinnitus or ear pain.  RESPIRATORY: No cough, shortness of breath,  wheezing or hemoptysis.  CARDIOVASCULAR: No chest pain, orthopnea, edema.  GASTROINTESTINAL: No nausea, vomiting, diarrhea or abdominal pain.  GENITOURINARY: No dysuria, hematuria.  ENDOCRINE: No polyuria, nocturia,  HEMATOLOGY: No anemia, easy bruising or bleeding SKIN: No rash or lesion. MUSCULOSKELETAL: Positive rheumatoid arthritis  NEUROLOGIC: No tingling, numbness, weakness.  PSYCHIATRY: No anxiety or depression.   MEDICATIONS AT HOME:   Prior to Admission medications   Medication Sig Start Date End Date Taking? Authorizing Provider  Adalimumab 40 MG/0.8ML PNKT Inject 40 mg into the skin every 14 (fourteen) days.  06/07/14  Yes Historical Provider, MD  alendronate (FOSAMAX) 70 MG tablet 1 tablet once a week. Take on Saturdays. 12/25/13  Yes Historical Provider, MD  benazepril (LOTENSIN) 40 MG tablet TAKE 1 TABLET BY MOUTH ONCE A DAY 01/31/14  Yes Historical Provider, MD  esomeprazole (NEXIUM) 20 MG capsule Take 20 mg by mouth daily at 12 noon.   Yes Historical Provider, MD  gemfibrozil (LOPID) 600 MG tablet TAKE 1 TABLET BY MOUTH ONCE DAILY 07/09/14  Yes Duanne Limerick, MD  metoprolol (LOPRESSOR) 50 MG tablet TAKE 1 TABLET BY MOUTH TWICE A DAY 08/21/14  Yes Historical Provider, MD  Omega-3 Fatty Acids (FISH OIL) 1000 MG CAPS Take 1,000 mg by mouth daily.   Yes Historical Provider, MD  predniSONE (DELTASONE) 5 MG tablet TAKE 1 TABLET (5 MG TOTAL) BY MOUTH ONCE DAILY. 03/08/14  Yes Historical Provider, MD  warfarin (COUMADIN) 1 MG tablet Take 1 mg by mouth every Monday, Wednesday, and Friday. Take along with 6mg  tablet to equal 7mg  total.   Yes Historical Provider, MD  warfarin (COUMADIN) 6 MG tablet Take 6 mg by mouth daily.   Yes Historical Provider, MD  warfarin (COUMADIN) 1 MG tablet Take 1 tablet (1 mg total) by mouth daily. 09/14/14   , MD      VITAL SIGNS:  Blood pressure 166/80, pulse 54, temperature 98.2 F (36.8 C), temperature source Oral, resp. rate 20, height 5'  3.5" (1.613 m), weight 72.576 kg (160 lb), SpO2 92 %.  PHYSICAL EXAMINATION:  GENERAL:  81 y.o.-year-old patient lying in the bed with no acute distress.  EYES: Pupils equal, round, reactive to light and accommodation. No scleral icterus. Extraocular muscles intact.  HEENT: Head atraumatic, normocephalic. Oropharynx and nasopharynx clear.  NECK:  Supple, no jugular venous distention. No thyroid enlargement, no tenderness.  LUNGS: Normal breath sounds bilaterally, no wheezing, rales,rhonchi or crepitation. No use of accessory muscles of respiration.  CARDIOVASCULAR: S1, S2 normal. No murmurs, rubs, or gallops.  ABDOMEN: Soft, nontender, nondistended. Bowel sounds present. No organomegaly or mass.  EXTREMITIES: No pedal edema, cyanosis, or clubbing. Tenderness right thigh and hip NEUROLOGIC: Cranial nerves II through XII are grossly intact. No focal deficits. PSYCHIATRIC: The patient is alert and oriented x 3.  SKIN: No obvious rash, lesion, or ulcer.   LABORATORY PANEL:   CBC  Recent Labs Lab 09/29/14 1455  WBC 9.5  HGB 11.5*  HCT 36.4  PLT 252   ------------------------------------------------------------------------------------------------------------------  Chemistries  No results for input(s): NA, K, CL, CO2, GLUCOSE, BUN, CREATININE, CALCIUM, MG, AST, ALT, ALKPHOS, BILITOT in the last 168 hours.  Invalid input(s): GFRCGP ------------------------------------------------------------------------------------------------------------------  Cardiac Enzymes No results for input(s): TROPONINI in the last 168 hours. ------------------------------------------------------------------------------------------------------------------  RADIOLOGY:  Dg Chest 1 View  09/29/2014   CLINICAL DATA:  Two falls within the last week. Bilateral hip pain. History of hypertension, atrial fibrillation.  EXAM: CHEST  1 VIEW  COMPARISON:  12/15/2013  FINDINGS: Heart is mildly enlarged. The lungs are  free of focal consolidations and pleural effusions. Mild perihilar peribronchial thickening. No pulmonary edema. No pneumothorax or acute displaced fractures. Deformity of the right proximal humerus is consistent with old fracture.  IMPRESSION: 1. Cardiomegaly without pulmonary edema. 2. Bronchitic changes.  No focal acute pulmonary abnormality.   Electronically Signed   By: 11/29/2014 M.D.   On: 09/29/2014 15:03   Dg Pelvis 1-2 Views  09/29/2014   CLINICAL DATA:  Fall today with acute right hip pain. Initial encounter.  EXAM: PELVIS - 1-2 VIEW  COMPARISON:  None.  FINDINGS: A right femoral neck fracture is identified without subluxation or dislocation.  Diffuse osteopenia is noted.  Degenerative changes in the lower lumbar spine are present.  No suspicious focal bony lesions are noted.  IMPRESSION: Right femoral neck fracture.   Electronically Signed   By: 11/29/2014 M.D.   On: 09/29/2014 14:49   Dg Femur Min 2 Views Left  09/29/2014   CLINICAL DATA:  Acute fall today with left leg pain. Initial encounter.  EXAM: LEFT FEMUR 2 VIEWS  COMPARISON:  None.  FINDINGS: There is no evidence of acute fracture, subluxation or dislocation.  Mild degenerative changes within the hip and knee noted.  No focal bony lesions are identified.  IMPRESSION: No evidence of acute bony abnormality.   Electronically Signed  By: Harmon Pier M.D.   On: 09/29/2014 14:48   Dg Femur, Min 2 Views Right  09/29/2014   CLINICAL DATA:  Patient from home. Larey Seat this morning after tripping over her walker. Shortening of the right leg.  EXAM: RIGHT FEMUR 2 VIEWS  COMPARISON:  None.  FINDINGS: There is an acute impacted fracture of the right subcapital femoral neck, associated with varus angulation and foreshortening of femur. No dislocation. Distal femur is intact.  IMPRESSION: Subcapital femoral neck fracture.   Electronically Signed   By: Norva Pavlov M.D.   On: 09/29/2014 14:47    EKG:  Sinus bradycardia no ST elevation  depression  IMPRESSION AND PLAN:  This is a very pleasant 81 year old female with a history of atrial fibrillation on Coumadin, essential hypertension and rheumatoid arthritis who presents after mechanical fall and unfortunately has suffered a acute impacted fracture of the right subcapital femoral neck.  1. Femoral neck fracture: Patient's preoperative risk is moderate risk for moderate procedure. She may proceed to surgery without further cardiac workup. Patient's INR is elevated and therefore vitamin K 2.5 mg by mouth will be given 1. INR will be checked tomorrow. Depending on INR level patient may proceed to surgery tomorrow. DVT prophylaxis as per orthopedic surgery.  2. Atrial fibrillation: Patient is on Coumadin. Coumadin is being held as explained above. Vitamin K has been given. INR 4 AM. Rate is adequately controlled at this time. She needs to continue preoperatively and postoperatively on metoprolol.  3. Essential hypertension: Patient's blood pressure is elevated due to pain. Patient will continue on Benzapril and the Toprol. Hydralazine when necessary has been ordered as well. Continue monitor blood pressure.  4. Rheumatoid arthritis: Patient is on daily steroids. We will continue prednisone 5 mg daily.    All the records are reviewed and case discussed with ED provider. Management plans discussed with the patient and she is in agreement.  CODE STATUS: FULL  TOTAL TIME TAKING CARE OF THIS PATIENT: 45 minutes.    Johanan Skorupski M.D on 09/29/2014 at 3:15 PM  Between 7am to 6pm - Pager - [phone] After 6pm go to [email or web address] - password [email or web address] [facility]  [place] [facility]  Office  [phone]  CC: Primary care physician; Elizabeth Sauer, MD

## 2014-09-29 NOTE — Consult Note (Signed)
ORTHOPAEDIC CONSULTATION  REQUESTING PHYSICIAN: Alford Highland, MD  Chief Complaint:   Right hip pain  History of Present Illness: Sonia Thompson is a 81 y.o. female who lives at home with her sister. The patient notes that she fell approximately one half weeks ago, injuring her left hip. She did not seek treatment immediately, but noted that her symptoms have begun to improve over the ensuing week. This morning, she awoke and found that her left hip was giving her more pain, so she began to use her walker to get around the house. Apparently she tripped over this walker and fell, landing on her right side. She complained of increased right hip pain and presented to the emergency room where x-rays demonstrated a varus angulated femoral neck fracture of the right hip. The patient denies any associated injuries as a result of this fall. Specifically, she denies any loss of consciousness or striking her head. She also denies any lightheadedness, dizziness, chest pain, shortness of breath, or other supinating symptoms that may have led to her fall. She denies any numbness or paresthesias to her right lower extremity.  Past Medical History  Diagnosis Date  . Hypertension   . Arthritis   . Osteoporosis   . Hyperlipidemia   . Atrial fibrillation   . Anemia   . Left rotator cuff tear    Past Surgical History  Procedure Laterality Date  . Appendectomy    . Cataract extraction, bilateral    . Bilateral carpal tunnel release     Social History   Social History  . Marital Status: Married    Spouse Name: N/A  . Number of Children: N/A  . Years of Education: N/A   Social History Main Topics  . Smoking status: Never Smoker   . Smokeless tobacco: Never Used  . Alcohol Use: No  . Drug Use: No  . Sexual Activity: Not Asked   Other Topics Concern  . None   Social History Narrative   History reviewed. No pertinent family  history. Allergies  Allergen Reactions  . Methotrexate Cough    Other reaction(s): Other (See Comments) Oral ulcers  . Tofacitinib Swelling    Other reaction(s): Other (See Comments) thrush   Prior to Admission medications   Medication Sig Start Date End Date Taking? Authorizing Provider  Adalimumab 40 MG/0.8ML PNKT Inject 40 mg into the skin every 14 (fourteen) days.  06/07/14  Yes Historical Provider, MD  alendronate (FOSAMAX) 70 MG tablet 1 tablet once a week. Take on Saturdays. 12/25/13  Yes Historical Provider, MD  benazepril (LOTENSIN) 40 MG tablet TAKE 1 TABLET BY MOUTH ONCE A DAY 01/31/14  Yes Historical Provider, MD  esomeprazole (NEXIUM) 20 MG capsule Take 20 mg by mouth daily at 12 noon.   Yes Historical Provider, MD  gemfibrozil (LOPID) 600 MG tablet TAKE 1 TABLET BY MOUTH ONCE DAILY 07/09/14  Yes Duanne Limerick, MD  metoprolol (LOPRESSOR) 50 MG tablet TAKE 1 TABLET BY MOUTH TWICE A DAY 08/21/14  Yes Historical Provider, MD  Omega-3 Fatty Acids (FISH OIL) 1000 MG CAPS Take 1,000 mg by mouth daily.   Yes Historical Provider, MD  predniSONE (DELTASONE) 5 MG tablet TAKE 1 TABLET (5 MG TOTAL) BY MOUTH ONCE DAILY. 03/08/14  Yes Historical Provider, MD  warfarin (COUMADIN) 1 MG tablet Take 1 mg by mouth every Monday, Wednesday, and Friday. Take along with 6mg  tablet to equal 7mg  total.   Yes Historical Provider, MD  warfarin (COUMADIN) 6 MG tablet Take  6 mg by mouth daily.   Yes Historical Provider, MD   Dg Chest 1 View  09/29/2014   CLINICAL DATA:  Two falls within the last week. Bilateral hip pain. History of hypertension, atrial fibrillation.  EXAM: CHEST  1 VIEW  COMPARISON:  12/15/2013  FINDINGS: Heart is mildly enlarged. The lungs are free of focal consolidations and pleural effusions. Mild perihilar peribronchial thickening. No pulmonary edema. No pneumothorax or acute displaced fractures. Deformity of the right proximal humerus is consistent with old fracture.  IMPRESSION: 1.  Cardiomegaly without pulmonary edema. 2. Bronchitic changes.  No focal acute pulmonary abnormality.   Electronically Signed   By: Norva Pavlov M.D.   On: 09/29/2014 15:03   Dg Pelvis 1-2 Views  09/29/2014   CLINICAL DATA:  Fall today with acute right hip pain. Initial encounter.  EXAM: PELVIS - 1-2 VIEW  COMPARISON:  None.  FINDINGS: A right femoral neck fracture is identified without subluxation or dislocation.  Diffuse osteopenia is noted.  Degenerative changes in the lower lumbar spine are present.  No suspicious focal bony lesions are noted.  IMPRESSION: Right femoral neck fracture.   Electronically Signed   By: Harmon Pier M.D.   On: 09/29/2014 14:49   Dg Femur Min 2 Views Left  09/29/2014   CLINICAL DATA:  Acute fall today with left leg pain. Initial encounter.  EXAM: LEFT FEMUR 2 VIEWS  COMPARISON:  None.  FINDINGS: There is no evidence of acute fracture, subluxation or dislocation.  Mild degenerative changes within the hip and knee noted.  No focal bony lesions are identified.  IMPRESSION: No evidence of acute bony abnormality.   Electronically Signed   By: Harmon Pier M.D.   On: 09/29/2014 14:48   Dg Femur, Min 2 Views Right  09/29/2014   CLINICAL DATA:  Patient from home. Larey Seat this morning after tripping over her walker. Shortening of the right leg.  EXAM: RIGHT FEMUR 2 VIEWS  COMPARISON:  None.  FINDINGS: There is an acute impacted fracture of the right subcapital femoral neck, associated with varus angulation and foreshortening of femur. No dislocation. Distal femur is intact.  IMPRESSION: Subcapital femoral neck fracture.   Electronically Signed   By: Norva Pavlov M.D.   On: 09/29/2014 14:47    Positive ROS: All other systems have been reviewed and were otherwise negative with the exception of those mentioned in the HPI and as above.  Physical Exam: General:  Alert, no acute distress Psychiatric:  Patient is competent for consent with normal mood and affect   Cardiovascular:  No  pedal edema Respiratory:  No wheezing, non-labored breathing GI:  Abdomen is soft and non-tender Skin:  No lesions in the area of chief complaint Neurologic:  Sensation intact distally Lymphatic:  No axillary or cervical lymphadenopathy  Orthopedic Exam:  Orthopedic examination is limited to the right hip and lower extremity. Skin inspection around the hip is unremarkable. The leg does not appear to be shortened and appears to be in neutral rotation as compared to the left lower extremity. She has tenderness to palpation over the anterior and lateral aspects of the hip. She also has pain with any attempted active or passive motion of the hip. She is neurovascularly intact to the right foot and lower extremity she can actively dorsiflex and plantar flex her toes and ankle. She has intact sensation to light touch to all distributions. She has excellent capillary refill to her foot.  X-rays:  Recent x-rays of the pelvis  and right hip are available for review. The findings are as described above. There is a minimally displaced fracture of the femoral neck with some varus angulation. No significant degenerative changes of the hip joint are noted. No lytic lesions are identified.   X-rays of the left hip also are reviewed. These films demonstrate no evidence for fractures, lytic lesions, or significant degenerative changes.  Assessment: Displaced right femoral neck fracture.  Plan: The treatment options were discussed with the patient. The patient would like to proceed with surgical intervention to include a right hip hemiarthroplasty. The procedure was discussed in detail with the patient, as were the potential risks (including bleeding, infection, nerve and/or blood vessel injury, persistent or recurrent pain, stiffness of the hip, leg length inequality, dislocation, loosening of and/or failure of the components, need for further surgery, blood clots, strokes, heart attacks and/or arrhythmias, etc.)  and benefits. The patient states her understanding and wishes to proceed. A consent has been signed.  Thank you for ask me to participate in the care of this most pleasant woman. I will be happy to follow her with you.   Maryagnes Amos, MD  Beeper #:  [phone]  09/29/2014 11:15 PM

## 2014-09-30 ENCOUNTER — Inpatient Hospital Stay: Payer: Medicare Other | Admitting: Anesthesiology

## 2014-09-30 ENCOUNTER — Encounter
Admission: EM | Disposition: A | Discharge status: Other Acute Inpatient Hospital | Payer: Self-pay | Source: Home / Self Care | Attending: Internal Medicine

## 2014-09-30 ENCOUNTER — Encounter: Payer: Self-pay | Admitting: Anesthesiology

## 2014-09-30 ENCOUNTER — Inpatient Hospital Stay: Payer: Medicare Other

## 2014-09-30 HISTORY — PX: HIP ARTHROPLASTY: SHX981

## 2014-09-30 LAB — BASIC METABOLIC PANEL
Anion gap: 6 (ref 5–15)
BUN: 17 mg/dL (ref 6–20)
CO2: 25 mmol/L (ref 22–32)
CREATININE: 0.53 mg/dL (ref 0.44–1.00)
Calcium: 8.2 mg/dL — ABNORMAL LOW (ref 8.9–10.3)
Chloride: 109 mmol/L (ref 101–111)
GFR calc Af Amer: >60 mL/min (ref >60)
GLUCOSE: 97 mg/dL (ref 65–99)
Potassium: 3.5 mmol/L (ref 3.5–5.1)
SODIUM: 140 mmol/L (ref 135–145)

## 2014-09-30 LAB — CBC
HEMATOCRIT: 34.9 % — AB (ref 35.0–47.0)
Hemoglobin: 11.2 g/dL — ABNORMAL LOW (ref 12.0–16.0)
MCH: 26.6 pg (ref 26.0–34.0)
MCHC: 32.1 g/dL (ref 32.0–36.0)
MCV: 82.9 fL (ref 80.0–100.0)
PLATELETS: 236 10*3/uL (ref 150–440)
RBC: 4.21 MIL/uL (ref 3.80–5.20)
RDW: 15.2 % — AB (ref 11.5–14.5)
WBC: 11 10*3/uL (ref 3.6–11.0)

## 2014-09-30 LAB — PROTIME-INR
INR: 1.35
Prothrombin Time: 16.9 seconds — ABNORMAL HIGH (ref 11.4–15.0)

## 2014-09-30 SURGERY — HEMIARTHROPLASTY, HIP, DIRECT ANTERIOR APPROACH, FOR FRACTURE
Anesthesia: General | Laterality: Right

## 2014-09-30 MED ORDER — ONDANSETRON HCL 4 MG/2ML IJ SOLN: 4.0000 mg | INTRAMUSCULAR | Status: DC

## 2014-09-30 MED ORDER — DEXAMETHASONE SODIUM PHOSPHATE 4 MG/ML IJ SOLN
INTRAMUSCULAR | Status: DC | PRN
  Administered 2014-09-30: 5 mg via INTRAVENOUS

## 2014-09-30 MED ORDER — SUCCINYLCHOLINE CHLORIDE 20 MG/ML IJ SOLN
INTRAMUSCULAR | Status: DC | PRN
Start: 2014-09-30 — End: 2014-09-30
  Administered 2014-09-30: 100 mg via INTRAVENOUS

## 2014-09-30 MED ORDER — ALUM & MAG HYDROXIDE-SIMETH 200-200-20 MG/5ML PO SUSP
30.0000 mL | ORAL | Status: DC | PRN
  Administered 2014-09-30: 30 mL via ORAL
  Filled 2014-09-30: qty 30

## 2014-09-30 MED ORDER — ENOXAPARIN SODIUM 40 MG/0.4ML ~~LOC~~ SOLN
40.0000 mg | SUBCUTANEOUS | Status: DC
  Administered 2014-09-30 – 2014-10-01 (×2): 40 mg via SUBCUTANEOUS
  Filled 2014-09-30 (×2): qty 0.4

## 2014-09-30 MED ORDER — TRANEXAMIC ACID 1000 MG/10ML IV SOLN
INTRAVENOUS | Status: DC | PRN
  Administered 2014-09-30: 1000 mg via INTRAVENOUS

## 2014-09-30 MED ORDER — NEOMYCIN-POLYMYXIN B GU 40-200000 IR SOLN
Status: AC
  Filled 2014-09-30: qty 20

## 2014-09-30 MED ORDER — ONDANSETRON HCL 4 MG/2ML IJ SOLN: 4.0000 mg | Freq: Four times a day (QID) | INTRAMUSCULAR | Status: DC | PRN

## 2014-09-30 MED ORDER — NEOMYCIN-POLYMYXIN B GU 40-200000 IR SOLN
Status: DC | PRN
  Administered 2014-09-30: 4 mL

## 2014-09-30 MED ORDER — FENTANYL CITRATE (PF) 100 MCG/2ML IJ SOLN: 25.0000 ug | INTRAMUSCULAR | Status: DC | PRN

## 2014-09-30 MED ORDER — KCL IN DEXTROSE-NACL 20-5-0.9 MEQ/L-%-% IV SOLN
INTRAVENOUS | Status: DC
  Administered 2014-09-30 – 2014-10-03 (×5): via INTRAVENOUS
  Filled 2014-09-30 (×9): qty 1000

## 2014-09-30 MED ORDER — DIPHENHYDRAMINE HCL 12.5 MG/5ML PO ELIX: 12.5000 mg | ORAL_SOLUTION | ORAL | Status: DC | PRN

## 2014-09-30 MED ORDER — DOCUSATE SODIUM 100 MG PO CAPS
100.0000 mg | ORAL_CAPSULE | Freq: Two times a day (BID) | ORAL | Status: DC
Start: 2014-09-30 — End: 2014-10-03
  Administered 2014-09-30 – 2014-10-03 (×7): 100 mg via ORAL
  Filled 2014-09-30 (×7): qty 1

## 2014-09-30 MED ORDER — ONDANSETRON HCL 4 MG/2ML IJ SOLN
4.0000 mg | Freq: Once | INTRAMUSCULAR | Status: AC
  Administered 2014-09-30: 4 mg via INTRAVENOUS
  Filled 2014-09-30: qty 2

## 2014-09-30 MED ORDER — PROPOFOL 10 MG/ML IV BOLUS
INTRAVENOUS | Status: DC | PRN
  Administered 2014-09-30: 120 mg via INTRAVENOUS

## 2014-09-30 MED ORDER — ACETAMINOPHEN 650 MG RE SUPP: 650.0000 mg | Freq: Four times a day (QID) | RECTAL | Status: DC | PRN

## 2014-09-30 MED ORDER — WARFARIN SODIUM 6 MG PO TABS
6.0000 mg | ORAL_TABLET | Freq: Once | ORAL | Status: AC
  Administered 2014-09-30: 6 mg via ORAL
  Filled 2014-09-30: qty 1

## 2014-09-30 MED ORDER — PROMETHAZINE HCL 25 MG/ML IJ SOLN: 25.0000 mg | Freq: Four times a day (QID) | INTRAMUSCULAR | Status: DC | PRN

## 2014-09-30 MED ORDER — ONDANSETRON HCL 4 MG/2ML IJ SOLN
INTRAMUSCULAR | Status: DC | PRN
  Administered 2014-09-30: 4 mg via INTRAVENOUS

## 2014-09-30 MED ORDER — PANTOPRAZOLE SODIUM 40 MG PO TBEC
40.0000 mg | DELAYED_RELEASE_TABLET | Freq: Two times a day (BID) | ORAL | Status: DC
  Administered 2014-09-30 – 2014-10-03 (×7): 40 mg via ORAL
  Filled 2014-09-30 (×7): qty 1

## 2014-09-30 MED ORDER — CEFAZOLIN SODIUM-DEXTROSE 2-3 GM-% IV SOLR
2.0000 g | Freq: Four times a day (QID) | INTRAVENOUS | Status: AC
Start: 2014-09-30 — End: 2014-10-01
  Administered 2014-09-30 – 2014-10-01 (×3): 2 g via INTRAVENOUS
  Filled 2014-09-30 (×3): qty 50

## 2014-09-30 MED ORDER — EPHEDRINE SULFATE 50 MG/ML IJ SOLN
INTRAMUSCULAR | Status: DC | PRN
  Administered 2014-09-30 (×2): 10 mg via INTRAVENOUS
  Administered 2014-09-30: 5 mg via INTRAVENOUS

## 2014-09-30 MED ORDER — ONDANSETRON HCL 4 MG PO TABS: 4.0000 mg | ORAL_TABLET | Freq: Four times a day (QID) | ORAL | Status: DC | PRN

## 2014-09-30 MED ORDER — LACTATED RINGERS IV SOLN
INTRAVENOUS | Status: DC | PRN
  Administered 2014-09-30: 10:00:00 via INTRAVENOUS

## 2014-09-30 MED ORDER — KETAMINE HCL 50 MG/ML IJ SOLN
INTRAMUSCULAR | Status: DC | PRN
  Administered 2014-09-30: 25 mg via INTRAVENOUS

## 2014-09-30 MED ORDER — OXYCODONE HCL 5 MG PO TABS
5.0000 mg | ORAL_TABLET | ORAL | Status: DC | PRN
  Administered 2014-09-30 – 2014-10-02 (×4): 5 mg via ORAL
  Filled 2014-09-30: qty 1
  Filled 2014-09-30: qty 2
  Filled 2014-09-30 (×2): qty 1

## 2014-09-30 MED ORDER — PROMETHAZINE HCL 25 MG/ML IJ SOLN
25.0000 mg | Freq: Once | INTRAMUSCULAR | Status: AC
  Administered 2014-09-30: 25 mg via INTRAMUSCULAR
  Filled 2014-09-30: qty 1

## 2014-09-30 MED ORDER — FENTANYL CITRATE (PF) 100 MCG/2ML IJ SOLN
INTRAMUSCULAR | Status: DC | PRN
  Administered 2014-09-30: 100 ug via INTRAVENOUS
  Administered 2014-09-30: 50 ug via INTRAVENOUS
  Administered 2014-09-30 (×4): 25 ug via INTRAVENOUS

## 2014-09-30 MED ORDER — SODIUM CHLORIDE 0.9 % IV SOLN
INTRAVENOUS | Status: DC
Start: 2014-09-30 — End: 2014-09-30
  Administered 2014-09-30: 03:00:00 via INTRAVENOUS

## 2014-09-30 MED ORDER — METOCLOPRAMIDE HCL 5 MG/ML IJ SOLN
5.0000 mg | Freq: Three times a day (TID) | INTRAMUSCULAR | Status: DC | PRN
  Filled 2014-09-30: qty 2

## 2014-09-30 MED ORDER — TRANEXAMIC ACID 1000 MG/10ML IV SOLN
INTRAVENOUS | Status: AC
  Filled 2014-09-30: qty 10

## 2014-09-30 MED ORDER — ONDANSETRON HCL 4 MG/2ML IJ SOLN: 4.0000 mg | Freq: Once | INTRAMUSCULAR | Status: DC | PRN

## 2014-09-30 MED ORDER — LIDOCAINE HCL (CARDIAC) 20 MG/ML IV SOLN
INTRAVENOUS | Status: DC | PRN
  Administered 2014-09-30: 50 mg via INTRAVENOUS

## 2014-09-30 MED ORDER — BUPIVACAINE LIPOSOME 1.3 % IJ SUSP
INTRAMUSCULAR | Status: AC
  Filled 2014-09-30: qty 20

## 2014-09-30 MED ORDER — METOCLOPRAMIDE HCL 5 MG PO TABS
5.0000 mg | ORAL_TABLET | Freq: Three times a day (TID) | ORAL | Status: DC | PRN
  Filled 2014-09-30: qty 2

## 2014-09-30 MED ORDER — METHYLPREDNISOLONE SODIUM SUCC 125 MG IJ SOLR
60.0000 mg | Freq: Four times a day (QID) | INTRAMUSCULAR | Status: AC
  Administered 2014-09-30 (×2): 60 mg via INTRAVENOUS
  Filled 2014-09-30 (×2): qty 2

## 2014-09-30 MED ORDER — BISACODYL 10 MG RE SUPP: 10.0000 mg | Freq: Every day | RECTAL | Status: DC | PRN

## 2014-09-30 MED ORDER — BUPIVACAINE LIPOSOME 1.3 % IJ SUSP
INTRAMUSCULAR | Status: DC | PRN
  Administered 2014-09-30: 20 mL

## 2014-09-30 MED ORDER — FLEET ENEMA 7-19 GM/118ML RE ENEM: 1.0000 | ENEMA | Freq: Once | RECTAL | Status: DC | PRN

## 2014-09-30 MED ORDER — VITAMIN K1 10 MG/ML IJ SOLN
1.0000 mg | Freq: Once | INTRAVENOUS | Status: AC
  Administered 2014-09-30: 1 mg via INTRAVENOUS
  Filled 2014-09-30: qty 0.1

## 2014-09-30 MED ORDER — METOPROLOL TARTRATE 50 MG PO TABS
50.0000 mg | ORAL_TABLET | Freq: Three times a day (TID) | ORAL | Status: DC
  Administered 2014-09-30 – 2014-10-03 (×8): 50 mg via ORAL
  Filled 2014-09-30 (×8): qty 1

## 2014-09-30 MED ORDER — HYDROMORPHONE HCL 1 MG/ML IJ SOLN
0.5000 mg | INTRAMUSCULAR | Status: DC | PRN
  Administered 2014-10-01 – 2014-10-02 (×4): 0.5 mg via INTRAVENOUS
  Administered 2014-10-02 – 2014-10-03 (×3): 1 mg via INTRAVENOUS
  Filled 2014-09-30 (×7): qty 1

## 2014-09-30 MED ORDER — MAGNESIUM HYDROXIDE 400 MG/5ML PO SUSP
30.0000 mL | Freq: Every day | ORAL | Status: DC | PRN
  Administered 2014-09-30 – 2014-10-01 (×2): 30 mL via ORAL
  Filled 2014-09-30 (×2): qty 30

## 2014-09-30 MED ORDER — LACTATED RINGERS IV SOLN: INTRAVENOUS | Status: DC | PRN

## 2014-09-30 MED ORDER — WARFARIN - PHARMACIST DOSING INPATIENT
Freq: Every day | Status: DC
  Administered 2014-10-01: 18:00:00

## 2014-09-30 MED ORDER — ACETAMINOPHEN 325 MG PO TABS
650.0000 mg | ORAL_TABLET | Freq: Four times a day (QID) | ORAL | Status: DC | PRN
  Administered 2014-10-02: 650 mg via ORAL
  Filled 2014-09-30: qty 2

## 2014-09-30 MED ORDER — BUPIVACAINE-EPINEPHRINE (PF) 0.25% -1:200000 IJ SOLN
INTRAMUSCULAR | Status: AC
  Filled 2014-09-30: qty 30

## 2014-09-30 SURGICAL SUPPLY — 52 items
BAG DECANTER FOR FLEXI CONT (MISCELLANEOUS) IMPLANT
BLADE SAGITTAL WIDE XTHICK NO (BLADE) ×3 IMPLANT
BLADE SURG SZ20 CARB STEEL (BLADE) ×3 IMPLANT
BNDG COHESIVE 6X5 TAN STRL LF (GAUZE/BANDAGES/DRESSINGS) ×3 IMPLANT
BOWL CEMENT MIXING ADV NOZZLE (MISCELLANEOUS) ×3 IMPLANT
CANISTER SUCT 1200ML W/VALVE (MISCELLANEOUS) ×3 IMPLANT
CAPT HIP HEMI 2 ×3 IMPLANT
CHLORAPREP W/TINT 26ML (MISCELLANEOUS) ×3 IMPLANT
DECANTER SPIKE VIAL GLASS SM (MISCELLANEOUS) ×6 IMPLANT
DRAPE IMP U-DRAPE 54X76 (DRAPES) ×6 IMPLANT
DRAPE INCISE IOBAN 66X60 STRL (DRAPES) ×3 IMPLANT
DRAPE SHEET LG 3/4 BI-LAMINATE (DRAPES) ×3 IMPLANT
DRAPE SURG 17X23 STRL (DRAPES) ×3 IMPLANT
DRSG OPSITE POSTOP 4X12 (GAUZE/BANDAGES/DRESSINGS) ×3 IMPLANT
DRSG OPSITE POSTOP 4X14 (GAUZE/BANDAGES/DRESSINGS) ×3 IMPLANT
ELECT BLADE 6.5 EXT (BLADE) ×3 IMPLANT
ELECT CAUTERY BLADE 6.4 (BLADE) ×3 IMPLANT
GAUZE PACK 2X3YD (MISCELLANEOUS) ×3 IMPLANT
GLOVE BIO SURGEON STRL SZ8 (GLOVE) ×18 IMPLANT
GLOVE INDICATOR 8.0 STRL GRN (GLOVE) ×3 IMPLANT
GOWN STRL REUS W/ TWL LRG LVL3 (GOWN DISPOSABLE) ×1 IMPLANT
GOWN STRL REUS W/ TWL XL LVL3 (GOWN DISPOSABLE) ×1 IMPLANT
GOWN STRL REUS W/TWL LRG LVL3 (GOWN DISPOSABLE) ×2
GOWN STRL REUS W/TWL XL LVL3 (GOWN DISPOSABLE) ×2
HANDPIECE SUCTION TUBG SURGILV (MISCELLANEOUS) ×3 IMPLANT
IV NS 100ML SINGLE PACK (IV SOLUTION) ×3 IMPLANT
NDL SAFETY 18GX1.5 (NEEDLE) ×3 IMPLANT
NEEDLE FILTER BLUNT 18X 1/2SAF (NEEDLE) ×2
NEEDLE FILTER BLUNT 18X1 1/2 (NEEDLE) ×1 IMPLANT
NEEDLE SPNL 20GX3.5 QUINCKE YW (NEEDLE) ×3 IMPLANT
NS IRRIG 1000ML POUR BTL (IV SOLUTION) ×3 IMPLANT
PACK HIP PROSTHESIS (MISCELLANEOUS) ×3 IMPLANT
PAD GROUND ADULT SPLIT (MISCELLANEOUS) ×3 IMPLANT
PILLOW ABDUC SM (MISCELLANEOUS) ×3 IMPLANT
SOL .9 NS 3000ML IRR  AL (IV SOLUTION) ×2
SOL .9 NS 3000ML IRR UROMATIC (IV SOLUTION) ×1 IMPLANT
STAPLER SKIN PROX 35W (STAPLE) ×3 IMPLANT
STRAP SAFETY BODY (MISCELLANEOUS) ×3 IMPLANT
SUT ETHIBOND #5 BRAIDED 30INL (SUTURE) ×3 IMPLANT
SUT ETHIBOND 2 V 37 (SUTURE) IMPLANT
SUT ETHIBOND CT1 BRD #0 30IN (SUTURE) IMPLANT
SUT QUILL PDO 2 24X24 VLT (SUTURE) IMPLANT
SUT VIC AB 0 CT1 27 (SUTURE)
SUT VIC AB 0 CT1 27XCR 8 STRN (SUTURE) IMPLANT
SUT VIC AB 1 CT1 36 (SUTURE) ×6 IMPLANT
SUT VIC AB 2-0 CT1 27 (SUTURE) ×4
SUT VIC AB 2-0 CT1 TAPERPNT 27 (SUTURE) ×2 IMPLANT
SYR 30ML LL (SYRINGE) ×3 IMPLANT
SYR TB 1ML 27GX1/2 LL (SYRINGE) ×3 IMPLANT
SYRINGE 10CC LL (SYRINGE) ×3 IMPLANT
TAPE TRANSPORE STRL 2 31045 (GAUZE/BANDAGES/DRESSINGS) ×3 IMPLANT
WATER STERILE IRR 1000ML POUR (IV SOLUTION) ×3 IMPLANT

## 2014-09-30 NOTE — Transfer of Care (Signed)
Immediate Anesthesia Transfer of Care Note  Patient: Sonia Thompson  Procedure(s) Performed: Procedure(s): ARTHROPLASTY BIPOLAR HIP (HEMIARTHROPLASTY) (Right)  Patient Location: PACU  Anesthesia Type:General  Level of Consciousness: awake  Airway & Oxygen Therapy: Patient Spontanous Breathing and Patient connected to face mask oxygen  Post-op Assessment: Report given to RN  Post vital signs: Reviewed  Last Vitals:  Filed Vitals:   09/30/14 1206  BP: 176/86  Pulse: 73  Temp: 38.2 C  Resp: 15    Complications: No apparent anesthesia complications

## 2014-09-30 NOTE — Progress Notes (Signed)
Pt says she is having indigestion.  She also said she can tell her a-fib is worse. Upon auscultation the heart is very irregular.  Pt says she just feels bad when this happens. I asked her what she does at home when she feels like this-she said she takes an extra dose of lopressor.  Dr sparks changed lopressor from bid to [facility]

## 2014-09-30 NOTE — Progress Notes (Signed)
Pt is feeling very nauseated and said she is miserable because of it.  She said the zofran is not helping.  It is currently not due again.  Dr Renae Gloss said to only give metoprolol this am and a once of phenergan IM ordered

## 2014-09-30 NOTE — Progress Notes (Signed)
DR wieting requesting stat EKG

## 2014-09-30 NOTE — Op Note (Signed)
09/29/2014 - 09/30/2014  12:00 PM  Patient:   Sonia Thompson  Pre-Op Diagnosis:   Displaced femoral neck fracture, right hip.  Post-Op Diagnosis:   Same  Procedure:   Right hip unipolar hemiarthroplasty.  Surgeon:   Maryagnes Amos, MD  Assistant:   [name], PA-C  Anesthesia:   General endotracheal  Findings:   As above.  Complications:   None  EBL:   150 cc  Fluids:   900 cc crystalloid  UOP:   100 cc  TT:   None  Drains:   None  Closure:   Staples  Implants:   Biomet press-fit system with a #13 standard offset Echo femoral stem, a 45 mm outer diameter shell, and a -3 mm neck adapter  Brief Clinical Note:   The patient is an 81 year old female who lives independently with her sister. Apparently, she tripped over her walker at home yesterday and landed on her right side. She was unable to get up and so presented to the emergency room where x-rays demonstrated the above-noted fracture. The patient has been cleared medically and presents at this time for definitive management of her injury.  Procedure:   The patient was brought into the operating room. After adequate general endotracheal intubation anesthesia was obtained, the patient was repositioned in the left lateral decubitus position and secured using a lateral hip positioner. The right hip and lower extremity were prepped with ChloroPrep solution before being draped sterilely. Preoperative antibiotics were administered. A timeout was performed to verify the appropriate surgical site before a standard posterior approach the hip was made through an approximately 4-5 inch incision. The incision was carried down through the subcutaneous tissues to expose the gluteal fascia and proximal end of the iliotibial band. These structures were split the length of the incision and the Charnley self-retaining hip retractor placed. The bursal tissues were swept posteriorly to expose the short external rotators. The anterior border  of the piriformis tendon was identified and this plane developed down through the capsule to enter the joint. Abundant fracture hematoma was suctioned. A flap of tissue was elevated off the posterior aspect of the femoral neck and greater trochanter and retracted posteriorly. This flap included the piriformis tendon, the short external rotators, and the posterior capsule. The femoral head was removed in its entirety, then taken to the back table where it was measured and found to be optimally replicated by a 45 mm head. The appropriate trial head was inserted and found to demonstrate an excellent suction fit.   Attention was directed to the femoral side. The femoral neck was recut 10-12 mm above the lesser trochanter using an oscillating saw. The piriformis fossa was debrided of soft tissues before the intramedullary canal was accessed through this point using a triple step reamer. The canal was reamed sequentially beginning with a #7 tapered reamer and progressing to a #13 tapered reamer. This provided excellent circumferential chatter. A box osteotome was used to establish version before the canal was broached sequentially beginning with a #7 broach and progressing to a #13 broach. This was left in place and several trial reductions performed. The permanent #13 standard offset femoral stem was impacted into place. A repeat trial reduction was performed using both the -6 mm and -3 mm neck lengths. The -3 mm neck length demonstrated excellent stability both in extension and external rotation as well as with flexion to 90 and internal rotation beyond 70. It also was stable in the position of sleep.  The 45 mm outer diameter shell with the -3 mm neck adapter construct was put together on the back table before being impacted onto the stem of the femoral component. The Morse taper locking mechanism was verified using manual distraction before the head was relocated and placed through a range of motion with the  findings as described above.  The wound was copiously irrigated with bacitracin saline solution via the jet lavage system before the peri-incisional and pericapsular tissues were injected with 30 cc of 0.5% Sensorcaine with epinephrine and 20 cc of Exparel diluted out to 60 cc with normal saline to help with postoperative analgesia. The posterior flap was reapproximated to the posterior aspect of the greater trochanter using #5 Tycron interrupted sutures placed through drill holes. The iliotibial band was reapproximated using #1 Vicryl interrupted sutures before the gluteal fascia was closed using a running #1 Vicryl suture. At this point, 1 g of transexemic acid in 10 cc of normal saline was injected into the joint to help reduce postoperative bleeding. The subcutaneous tissues were closed in several layers using 2-0 Vicryl interrupted sutures before the skin was closed using staples. A sterile occlusive dressing was applied to the wound before the patient was placed into an abduction wedge pillow. She was then rolled back into the supine position on her hospital bed before she was awakened, extubated, and returned to the recovery room in satisfactory condition after tolerating the procedure well.

## 2014-09-30 NOTE — Anesthesia Preprocedure Evaluation (Addendum)
Anesthesia Evaluation  Patient identified by MRN, date of birth, ID band Patient awake    Reviewed: Allergy & Precautions, H&P , NPO status , Patient's Chart, lab work & pertinent test results, reviewed documented beta blocker date and time   History of Anesthesia Complications (+) PONV and history of anesthetic complications  Airway Mallampati: III  TM Distance: >3 FB Neck ROM: full    Dental  (+) Missing, Poor Dentition   Pulmonary neg pulmonary ROS,  breath sounds clear to auscultation  Pulmonary exam normal       Cardiovascular Exercise Tolerance: Good hypertension, - angina- CAD, - Past MI, - Cardiac Stents and - CABG Normal cardiovascular exam+ dysrhythmias Atrial Fibrillation + Valvular Problems/Murmurs (Grade III/VI SEM best at RUSB, radiating to carotids (likely AS)) AS Rhythm:regular Rate:Normal     Neuro/Psych negative neurological ROS  negative psych ROS   GI/Hepatic negative GI ROS, Neg liver ROS, GERD-  ,  Endo/Other  negative endocrine ROS  Renal/GU negative Renal ROS  negative genitourinary   Musculoskeletal   Abdominal   Peds  Hematology  (+) Blood dyscrasia, anemia ,   Anesthesia Other Findings Past Medical History:   Hypertension                                                 Arthritis                                                    Osteoporosis                                                 Hyperlipidemia                                               Atrial fibrillation                                          Anemia                                                       Left rotator cuff tear                                       Reproductive/Obstetrics negative OB ROS                            Anesthesia Physical Anesthesia Plan  ASA: III  Anesthesia Plan: General   Post-op Pain Management:    Induction:   Airway Management Planned:   Additional  Equipment:  Intra-op Plan:   Post-operative Plan:   Informed Consent: I have reviewed the patients History and Physical, chart, labs and discussed the procedure including the risks, benefits and alternatives for the proposed anesthesia with the patient or authorized representative who has indicated his/her understanding and acceptance.   Dental Advisory Given  Plan Discussed with: Anesthesiologist, CRNA and Surgeon  Anesthesia Plan Comments:         Anesthesia Quick Evaluation

## 2014-09-30 NOTE — Progress Notes (Signed)
Patient ID: Sonia Thompson, female   DOB: [birth year], 81 y.o.   MRN: [id number] [facility] PROGRESS NOTE  PCP: Elizabeth Sauer, MD  HPI/Subjective: Called by nurse secondary to nausea. Patient just moaning because of the nausea. As per sister, she gets nauseous quite a bit. Also has constipation.  Objective: Filed Vitals:   09/30/14 0756  BP: 176/61  Pulse: 63  Temp: 98 F (36.7 C)  Resp: 16    Filed Weights   09/29/14 1333  Weight: 72.576 kg (160 lb)    ROS: Review of Systems  Unable to perform ROS  patient very uncomfortable and moaning with the nausea.  Exam: Physical Exam  HENT:  Nose: No mucosal edema.  Mouth/Throat: No oropharyngeal exudate or posterior oropharyngeal edema.  Eyes: Conjunctivae, EOM and lids are normal. Pupils are equal, round, and reactive to light.  Neck: No JVD present. Carotid bruit is not present. No edema present. No thyroid mass and no thyromegaly present.  Cardiovascular: S1 normal and S2 normal.  Exam reveals no gallop.   Murmur heard.  Systolic murmur is present with a grade of 4/6  Pulses:      Dorsalis pedis pulses are 2+ on the right side, and 2+ on the left side.  Respiratory: No respiratory distress. She has no wheezes. She has no rhonchi. She has no rales.  GI: Soft. Bowel sounds are normal. There is no tenderness.  Musculoskeletal:       Right ankle: She exhibits no swelling.       Left ankle: She exhibits no swelling.  Lymphadenopathy:    She has no cervical adenopathy.  Neurological: She is alert.  Skin: Skin is warm. No rash noted. Nails show no clubbing.  Psychiatric: She has a normal mood and affect. She is agitated.    Data Reviewed: Basic Metabolic Panel:  Recent Labs Lab 09/29/14 1455 09/30/14 0724  NA 139 140  K 3.8 3.5  CL 108 109  CO2 25 25  GLUCOSE 98 97  BUN 18 17  CREATININE 0.58 0.53  CALCIUM 8.1* 8.2*   CBC:  Recent Labs Lab 09/29/14 1455 09/30/14 0724  WBC 9.5 11.0  HGB  11.5* 11.2*  HCT 36.4 34.9*  MCV 82.5 82.9  PLT 252 236    Studies: Dg Chest 1 View  09/29/2014   CLINICAL DATA:  Two falls within the last week. Bilateral hip pain. History of hypertension, atrial fibrillation.  EXAM: CHEST  1 VIEW  COMPARISON:  12/15/2013  FINDINGS: Heart is mildly enlarged. The lungs are free of focal consolidations and pleural effusions. Mild perihilar peribronchial thickening. No pulmonary edema. No pneumothorax or acute displaced fractures. Deformity of the right proximal humerus is consistent with old fracture.  IMPRESSION: 1. Cardiomegaly without pulmonary edema. 2. Bronchitic changes.  No focal acute pulmonary abnormality.   Electronically Signed   By: Norva Pavlov M.D.   On: 09/29/2014 15:03   Dg Pelvis 1-2 Views  09/29/2014   CLINICAL DATA:  Fall today with acute right hip pain. Initial encounter.  EXAM: PELVIS - 1-2 VIEW  COMPARISON:  None.  FINDINGS: A right femoral neck fracture is identified without subluxation or dislocation.  Diffuse osteopenia is noted.  Degenerative changes in the lower lumbar spine are present.  No suspicious focal bony lesions are noted.  IMPRESSION: Right femoral neck fracture.   Electronically Signed   By: Harmon Pier M.D.   On: 09/29/2014 14:49   Dg Femur Min 2 Views Left  09/29/2014  CLINICAL DATA:  Acute fall today with left leg pain. Initial encounter.  EXAM: LEFT FEMUR 2 VIEWS  COMPARISON:  None.  FINDINGS: There is no evidence of acute fracture, subluxation or dislocation.  Mild degenerative changes within the hip and knee noted.  No focal bony lesions are identified.  IMPRESSION: No evidence of acute bony abnormality.   Electronically Signed   By: Harmon Pier M.D.   On: 09/29/2014 14:48   Dg Femur, Min 2 Views Right  09/29/2014   CLINICAL DATA:  Patient from home. Larey Seat this morning after tripping over her walker. Shortening of the right leg.  EXAM: RIGHT FEMUR 2 VIEWS  COMPARISON:  None.  FINDINGS: There is an acute impacted fracture  of the right subcapital femoral neck, associated with varus angulation and foreshortening of femur. No dislocation. Distal femur is intact.  IMPRESSION: Subcapital femoral neck fracture.   Electronically Signed   By: Norva Pavlov M.D.   On: 09/29/2014 14:47    Scheduled Meds: . benazepril  40 mg Oral Daily  .  ceFAZolin (ANCEF) IV  2 g Intravenous 30 min Pre-Op  . cefTRIAXone (ROCEPHIN)  IV  1 g Intravenous Q24H  . gemfibrozil  600 mg Oral Daily  . metoprolol  50 mg Oral BID  . ondansetron (ZOFRAN) IV  4 mg Intravenous 6 times per day  . phenazopyridine  100 mg Oral TID WC  . predniSONE  5 mg Oral Q breakfast  . promethazine  25 mg Intramuscular Once   Continuous Infusions: . sodium chloride 75 mL/hr at 09/30/14 0329    Assessment/Plan:  1. Preoperative consultation for right femoral neck fracture. I do not see an EKG on the chart. I will order one. INR acceptable now for surgery. Can proceed. 2. Nausea- likely secondary to pain medications. I will given IM Phenergan dose stat and Zofran around the clock. 3. Atrial fibrillation- Coumadin reversed with vitamin K. Start as soon as possible after surgery. 4. Acute cystitis without hematuria on Rocephin follow-up urine culture 5. Rheumatoid arthritis on chronic prednisone- will give stress dose steroids 3 doses. 6. Hyperlipidemia unspecified continue gemfibrozil.  Code Status:     Code Status Orders        Start     Ordered   09/29/14 1717  Full code   Continuous     09/29/14 1716     Family Communication: Sister at bedside Disposition Plan: Rehabilitation in about 3 days  Consultants:  Orthopedic surgery  Antibiotics:  Rocephin  Time spent: 25 minutes  Alford Highland  [facility]

## 2014-09-30 NOTE — Progress Notes (Signed)
Patient ID: Sonia Thompson, female   DOB: April 02, 1932, 81 y.o.   MRN: [id number]  EKG in computer- sinus bradycardia with arrythmia, LVH, non specific ST-t wave changes  Can proceed with surgery  Dr. Alford Highland

## 2014-09-30 NOTE — Consult Note (Addendum)
ANTICOAGULATION CONSULT NOTE - Initial Consult  Pharmacy Consult for warfarin Indication: atrial fibrillation  Allergies  Allergen Reactions  . Methotrexate Cough    Other reaction(s): Other (See Comments) Oral ulcers  . Tofacitinib Swelling    Other reaction(s): Other (See Comments) thrush    Patient Measurements: Height: 5' 3.5" (161.3 cm) Weight: 160 lb (72.576 kg) IBW/kg (Calculated) : 53.55 Heparin Dosing Weight:   Vital Signs: Temp: 97.9 F (36.6 C) (09/04 1326) Temp Source: Oral (09/04 1326) BP: 157/61 mmHg (09/04 1326) Pulse Rate: 61 (09/04 1326)  Labs:  Recent Labs  09/29/14 1455 09/29/14 2324 09/30/14 0724  HGB 11.5*  --  11.2*  HCT 36.4  --  34.9*  PLT 252  --  236  LABPROT 25.4* 20.9* 16.9*  INR 2.30 1.78 1.35  CREATININE 0.58  --  0.53    Estimated Creatinine Clearance: 54.2 mL/min (by C-G formula based on Cr of 0.53).   Medical History: Past Medical History  Diagnosis Date  . Hypertension   . Arthritis   . Osteoporosis   . Hyperlipidemia   . Atrial fibrillation   . Anemia   . Left rotator cuff tear     Medications:  Scheduled:  . benazepril  40 mg Oral Daily  .  ceFAZolin (ANCEF) IV  2 g Intravenous Q6H  . cefTRIAXone (ROCEPHIN)  IV  1 g Intravenous Q24H  . docusate sodium  100 mg Oral BID  . gemfibrozil  600 mg Oral Daily  . methylPREDNISolone (SOLU-MEDROL) injection  60 mg Intravenous Q6H  . metoprolol  50 mg Oral BID  . pantoprazole  40 mg Oral BID  . phenazopyridine  100 mg Oral TID WC  . predniSONE  5 mg Oral Q breakfast    Assessment: Pt is a 81 year old female with a history of afib. Patient suffered a hip fracture and underwent a right hip unipolar hemiarthroplasty. Restarting warfarin therapy after surgery. PT INR this AM was 1.35, on admission she was therapeutic at 2.3. Pt to receive lovenox 40 q 24 hr  Goal of Therapy:  INR 2-3 Monitor platelets by anticoagulation protocol: Yes   Plan:  Will restart patient home  dose of warfarin 7mg  MWF and 6mg  all other days. Will check INR in the AM. Continue lovenox until INR therapeutic x 2  Kushi Kun D Lyon Dumont 09/30/2014,1:56 PM

## 2014-09-30 NOTE — Progress Notes (Signed)
INR 1.78 will give 1 mg Vitamin K  IV  per request of Poggi MD     If INR > 1.6. To prepare for surgery

## 2014-09-30 NOTE — Anesthesia Procedure Notes (Addendum)
Procedure Name: Intubation Performed by: Mathews Argyle Pre-anesthesia Checklist: Patient identified, Patient being monitored, Timeout performed, Emergency Drugs available and Suction available Patient Re-evaluated:Patient Re-evaluated prior to inductionOxygen Delivery Method: Circle system utilized Preoxygenation: Pre-oxygenation with 100% oxygen Intubation Type: IV induction, Rapid sequence and Cricoid Pressure applied Laryngoscope Size: 2 and Miller Grade View: Grade I Tube type: Oral Tube size: 7.0 mm Number of attempts: 1 Airway Equipment and Method: Stylet Placement Confirmation: ETT inserted through vocal cords under direct vision,  positive ETCO2 and breath sounds checked- equal and bilateral Secured at: 21 cm Tube secured with: Tape Dental Injury: Teeth and Oropharynx as per pre-operative assessment  Comments: OP clear

## 2014-10-01 LAB — BASIC METABOLIC PANEL
Anion gap: 4 — ABNORMAL LOW (ref 5–15)
BUN: 15 mg/dL (ref 6–20)
CALCIUM: 8 mg/dL — AB (ref 8.9–10.3)
CHLORIDE: 109 mmol/L (ref 101–111)
CO2: 26 mmol/L (ref 22–32)
CREATININE: 0.59 mg/dL (ref 0.44–1.00)
GFR calc non Af Amer: >60 mL/min (ref >60)
Glucose, Bld: 135 mg/dL — ABNORMAL HIGH (ref 65–99)
Potassium: 4.1 mmol/L (ref 3.5–5.1)
SODIUM: 139 mmol/L (ref 135–145)

## 2014-10-01 LAB — CBC WITH DIFFERENTIAL/PLATELET
BASOS PCT: 0 %
Basophils Absolute: 0 10*3/uL (ref 0–0.1)
EOS ABS: 0 10*3/uL (ref 0–0.7)
Eosinophils Relative: 0 %
HEMATOCRIT: 30.7 % — AB (ref 35.0–47.0)
HEMOGLOBIN: 9.9 g/dL — AB (ref 12.0–16.0)
LYMPHS ABS: 1.6 10*3/uL (ref 1.0–3.6)
Lymphocytes Relative: 14 %
MCH: 26.4 pg (ref 26.0–34.0)
MCHC: 32.1 g/dL (ref 32.0–36.0)
MCV: 82.3 fL (ref 80.0–100.0)
MONO ABS: 1 10*3/uL — AB (ref 0.2–0.9)
MONOS PCT: 9 %
Neutro Abs: 9 10*3/uL — ABNORMAL HIGH (ref 1.4–6.5)
Neutrophils Relative %: 77 %
Platelets: 215 10*3/uL (ref 150–440)
RBC: 3.73 MIL/uL — ABNORMAL LOW (ref 3.80–5.20)
RDW: 14.8 % — AB (ref 11.5–14.5)
WBC: 11.6 10*3/uL — ABNORMAL HIGH (ref 3.6–11.0)

## 2014-10-01 LAB — PROTIME-INR
INR: 1.22
Prothrombin Time: 15.6 seconds — ABNORMAL HIGH (ref 11.4–15.0)

## 2014-10-01 LAB — TROPONIN I: TROPONIN I: 0.58 ng/mL — AB (ref <0.031)

## 2014-10-01 MED ORDER — ASPIRIN EC 81 MG PO TBEC
81.0000 mg | DELAYED_RELEASE_TABLET | Freq: Every day | ORAL | Status: DC
  Administered 2014-10-01 – 2014-10-03 (×3): 81 mg via ORAL
  Filled 2014-10-01 (×3): qty 1

## 2014-10-01 MED ORDER — WARFARIN SODIUM 6 MG PO TABS
7.0000 mg | ORAL_TABLET | ORAL | Status: DC
  Administered 2014-10-01: 7 mg via ORAL
  Filled 2014-10-01: qty 1

## 2014-10-01 MED ORDER — WARFARIN SODIUM 6 MG PO TABS
6.0000 mg | ORAL_TABLET | ORAL | Status: DC
  Filled 2014-10-01: qty 1

## 2014-10-01 MED ORDER — MORPHINE SULFATE (PF) 2 MG/ML IV SOLN: 1.0000 mg | INTRAVENOUS | Status: DC | PRN

## 2014-10-01 NOTE — Progress Notes (Signed)
MD paged for troponin level 0.58. Dr. Renae Gloss notified and new orders placed in [facility] [place]. Will cont to monitor.

## 2014-10-01 NOTE — Progress Notes (Signed)
Patient ID: Sonia Thompson, female   DOB: Jun 01, 1932, 81 y.o.   MRN: [id number] [facility] PROGRESS NOTE  PCP: Elizabeth Sauer, MD  HPI/Subjective: Patient is reporting burning micturition. Denies any palpitations, nausea or vomiting . Reports she gets nauseous  quite a bit  Objective: Filed Vitals:   10/01/14 1227  BP: 168/105  Pulse: 73  Temp:   Resp:     Filed Weights   09/29/14 1333  Weight: 72.576 kg (160 lb)    ROS: Review of Systems  Constitutional: Negative for fever, chills and diaphoresis.  HENT: Negative for ear discharge, ear pain and hearing loss.   Eyes: Negative for photophobia and pain.  Respiratory: Negative for cough and hemoptysis.   Cardiovascular: Negative for chest pain and palpitations.  Gastrointestinal: Negative for heartburn, nausea and vomiting.  Genitourinary: Positive for urgency and frequency.  Musculoskeletal: Positive for joint pain. Negative for myalgias and neck pain.  Skin: Negative for itching and rash.  Neurological: Negative for dizziness, tingling, weakness and headaches.  Psychiatric/Behavioral: Negative for depression and memory loss.    Exam: Physical Exam  HENT:  Nose: No mucosal edema.  Mouth/Throat: No oropharyngeal exudate or posterior oropharyngeal edema.  Eyes: Conjunctivae, EOM and lids are normal. Pupils are equal, round, and reactive to light.  Neck: No JVD present. Carotid bruit is not present. No edema present. No thyroid mass and no thyromegaly present.  Cardiovascular: S1 normal and S2 normal.  Exam reveals no gallop.   Murmur heard.  Systolic murmur is present with a grade of 4/6  Pulses:      Dorsalis pedis pulses are 2+ on the right side, and 2+ on the left side.  Respiratory: No respiratory distress. She has no wheezes. She has no rhonchi. She has no rales.  GI: Soft. Bowel sounds are normal. There is no tenderness.  Musculoskeletal:       Right ankle: She exhibits no swelling.       Left  ankle: She exhibits no swelling.  Lymphadenopathy:    She has no cervical adenopathy.  Neurological: She is alert.  Skin: Skin is warm. No rash noted. Nails show no clubbing.  Psychiatric: She has a normal mood and affect.    Data Reviewed: Basic Metabolic Panel:  Recent Labs Lab 09/29/14 1455 09/30/14 0724 10/01/14 0631  NA 139 140 139  K 3.8 3.5 4.1  CL 108 109 109  CO2 25 25 26   GLUCOSE 98 97 135*  BUN 18 17 15   CREATININE 0.58 0.53 0.59  CALCIUM 8.1* 8.2* 8.0*   CBC:  Recent Labs Lab 09/29/14 1455 09/30/14 0724 10/01/14 0631  WBC 9.5 11.0 11.6*  NEUTROABS  --   --  9.0*  HGB 11.5* 11.2* 9.9*  HCT 36.4 34.9* 30.7*  MCV 82.5 82.9 82.3  PLT 252 236 215    Studies: Dg Chest 1 View  09/29/2014   CLINICAL DATA:  Two falls within the last week. Bilateral hip pain. History of hypertension, atrial fibrillation.  EXAM: CHEST  1 VIEW  COMPARISON:  12/15/2013  FINDINGS: Heart is mildly enlarged. The lungs are free of focal consolidations and pleural effusions. Mild perihilar peribronchial thickening. No pulmonary edema. No pneumothorax or acute displaced fractures. Deformity of the right proximal humerus is consistent with old fracture.  IMPRESSION: 1. Cardiomegaly without pulmonary edema. 2. Bronchitic changes.  No focal acute pulmonary abnormality.   Electronically Signed   By: 11/29/2014 M.D.   On: 09/29/2014 15:03   Dg  Pelvis 1-2 Views  09/29/2014   CLINICAL DATA:  Fall today with acute right hip pain. Initial encounter.  EXAM: PELVIS - 1-2 VIEW  COMPARISON:  None.  FINDINGS: A right femoral neck fracture is identified without subluxation or dislocation.  Diffuse osteopenia is noted.  Degenerative changes in the lower lumbar spine are present.  No suspicious focal bony lesions are noted.  IMPRESSION: Right femoral neck fracture.   Electronically Signed   By: Harmon Pier M.D.   On: 09/29/2014 14:49   Dg Hip Port Unilat With Pelvis 1v Right  09/30/2014   CLINICAL DATA:   Right hip hemiarthroplasty.  EXAM: DG HIP (WITH OR WITHOUT PELVIS) 1V PORT RIGHT  COMPARISON:  09/29/2014 radiograph  FINDINGS: Right hip hemiarthroplasty identified without complicating features.  There is no evidence of acute fracture or subluxation.  IMPRESSION: Right hip hemiarthroplasty without complicating features.   Electronically Signed   By: Harmon Pier M.D.   On: 09/30/2014 13:36   Dg Femur Min 2 Views Left  09/29/2014   CLINICAL DATA:  Acute fall today with left leg pain. Initial encounter.  EXAM: LEFT FEMUR 2 VIEWS  COMPARISON:  None.  FINDINGS: There is no evidence of acute fracture, subluxation or dislocation.  Mild degenerative changes within the hip and knee noted.  No focal bony lesions are identified.  IMPRESSION: No evidence of acute bony abnormality.   Electronically Signed   By: Harmon Pier M.D.   On: 09/29/2014 14:48   Dg Femur, Min 2 Views Right  09/29/2014   CLINICAL DATA:  Patient from home. Larey Seat this morning after tripping over her walker. Shortening of the right leg.  EXAM: RIGHT FEMUR 2 VIEWS  COMPARISON:  None.  FINDINGS: There is an acute impacted fracture of the right subcapital femoral neck, associated with varus angulation and foreshortening of femur. No dislocation. Distal femur is intact.  IMPRESSION: Subcapital femoral neck fracture.   Electronically Signed   By: Norva Pavlov M.D.   On: 09/29/2014 14:47    Scheduled Meds: . benazepril  40 mg Oral Daily  . cefTRIAXone (ROCEPHIN)  IV  1 g Intravenous Q24H  . docusate sodium  100 mg Oral BID  . enoxaparin (LOVENOX) injection  40 mg Subcutaneous Q24H  . gemfibrozil  600 mg Oral Daily  . metoprolol tartrate  50 mg Oral 3 times per day  . pantoprazole  40 mg Oral BID  . phenazopyridine  100 mg Oral TID WC  . predniSONE  5 mg Oral Q breakfast  . Warfarin - Pharmacist Dosing Inpatient   Does not apply q1800   Continuous Infusions: . dextrose 5 % and 0.9 % NaCl with KCl 20 mEq/L 75 mL/hr at 10/01/14 1146     Assessment/Plan:  1. Preoperative consultation for right femoral neck fracture. status post surgery . PT consult  2. Nausea- likely secondary to pain medications and acute cystitis . Provide  IM Phenergan dose as needed and Zofran around the clock. 3. Atrial fibrillation- Coumadin reversed with vitamin K.  resumed Coumadin from yesterday. Follow PT/INR 4. Acute cystitis without hematuria on Rocephin. follow-up urine culture reveals greater than 100,000 colonies , sensitivities pending, 5. Rheumatoid arthritis on chronic prednisone- received  stress dose steroids 3 doses. 6. Hyperlipidemia unspecified continue gemfibrozil.  Code Status:     Code Status Orders        Start     Ordered   09/29/14 1717  Full code   Continuous     09/29/14  66     Family Communication: Sister at bedside Disposition Plan: Rehabilitation in about 3 days  Consultants:  Orthopedic surgery  Antibiotics:  Rocephin  Time spent: 35  minutes  Andre Gallego  [facility]

## 2014-10-01 NOTE — Anesthesia Postprocedure Evaluation (Signed)
  Anesthesia Post-op Note  Patient: Sonia Thompson  Procedure(s) Performed: Procedure(s): ARTHROPLASTY BIPOLAR HIP (HEMIARTHROPLASTY) (Right)  Anesthesia type:General  Patient location: 149  Post pain: Pain level controlled  Post assessment: Post-op Vital signs reviewed, Patient's Cardiovascular Status Stable, Respiratory Function Stable, Patent Airway and No signs of Nausea or vomiting  Post vital signs: Reviewed and stable  Last Vitals:  Filed Vitals:   10/01/14 0709  BP: 154/68  Pulse: 61  Temp: 36.7 C  Resp: 18    Level of consciousness: awake, alert  and patient cooperative  Complications: No apparent anesthesia complications

## 2014-10-01 NOTE — Evaluation (Signed)
Physical Therapy Evaluation Patient Details Name: Sonia Thompson MRN: [id number] DOB: 1932/07/04 Today's Date: 10/01/2014   History of Present Illness  Pt had a fall ~2 weeks ago with L hip pain (no fx) started using a walker and tripped over walker fractuing her R hip needing hemiarthoplasty  Clinical Impression  Pt shows good effort and willingness to participate but has a surprisingly hard time with standing/walking secondary to R leg weakness.  She shows good effort with ~10 minutes of exercises in bed apart from the PT exam.  Pt normally very independent at home alone, but with pain in L hip from recent fall as well as typical post-op R hip issues she struggles with mobility acts.    Follow Up Recommendations SNF    Equipment Recommendations       Recommendations for Other Services       Precautions / Restrictions Precautions Precautions: Posterior Hip Restrictions Weight Bearing Restrictions: Yes RLE Weight Bearing: Weight bearing as tolerated      Mobility  Bed Mobility Overal bed mobility: Needs Assistance Bed Mobility: Supine to Sit     Supine to sit: Mod assist     General bed mobility comments: Pt shows good effort, but does need assist with LEs and trunk  Transfers Overall transfer level: Needs assistance Equipment used: Rolling walker (2 wheeled) Transfers: Sit to/from Stand Sit to Stand: Mod assist;Max assist         General transfer comment: 3 attempts at standing and pt does not do as well as either of [place] expected.  She has significant buckling in R knee and is unable to keep herself supported with the walker   Ambulation/Gait Ambulation/Gait assistance: Max assist Ambulation Distance (Feet): 3 Feet Assistive device: Rolling walker (2 wheeled)       General Gait Details: Pt only able to go from bed to recliner with heavy assist and cuing.  She is highly reliant on the walker, but her R LE is too weak to keep her up needing heavy PT  assist  Stairs            Wheelchair Mobility    Modified Rankin (Stroke Patients Only)       Balance                                             Pertinent Vitals/Pain Pain Assessment: 0-10 Pain Score: 4     Home Living Family/patient expects to be discharged to:: Skilled nursing facility                      Prior Function Level of Independence: Independent         Comments: Pt reports she was out of the house regualrly, ran all her errands (groceries, etc) and generally was able to do all she needed.      Hand Dominance        Extremity/Trunk Assessment   Upper Extremity Assessment: Overall WFL for tasks assessed           Lower Extremity Assessment: Generalized weakness (R hip with post-op weakness, has AROM t/o )         Communication   Communication: No difficulties  Cognition Arousal/Alertness: Awake/alert Behavior During Therapy: WFL for tasks assessed/performed Overall Cognitive Status: Within Functional Limits for tasks assessed  General Comments      Exercises Total Joint Exercises Ankle Circles/Pumps: AROM;10 reps Quad Sets: AROM;10 reps Gluteal Sets: AROM;10 reps Heel Slides: 5 reps;AROM Hip ABduction/ADduction: AROM;5 reps      Assessment/Plan    PT Assessment Patient needs continued PT services  PT Diagnosis Difficulty walking;Generalized weakness   PT Problem List Decreased strength;Decreased range of motion;Decreased activity tolerance;Decreased balance;Decreased mobility;Decreased cognition;Decreased knowledge of use of DME;Decreased safety awareness;Decreased knowledge of precautions  PT Treatment Interventions DME instruction;Gait training;Stair training;Functional mobility training;Therapeutic activities;Therapeutic exercise;Balance training;Neuromuscular re-education;Cognitive remediation   PT Goals (Current goals can be found in the Care Plan section) Acute  Rehab PT Goals Patient Stated Goal: pt eager to get stronger (both legs) PT Goal Formulation: With patient Time For Goal Achievement: 10/15/14 Potential to Achieve Goals: Good    Frequency BID   Barriers to discharge        Co-evaluation               End of Session   Activity Tolerance: Patient tolerated treatment well Patient left: with chair alarm set Nurse Communication: Mobility status         Time: 9169-4503 PT Time Calculation (min) (ACUTE ONLY): 35 min   Charges:   PT Evaluation $Initial PT Evaluation Tier I: 1 Procedure PT Treatments $Therapeutic Exercise: 8-22 mins   PT G Codes:       Loran Senters, PT, DPT [phone]  Malachi Pro 10/01/2014, 2:47 PM

## 2014-10-01 NOTE — Progress Notes (Signed)
Pt complaining of pain across shoulders. Dr Arita Miss notified. Give 1400 dose of lopressor now.

## 2014-10-01 NOTE — Consult Note (Signed)
ANTICOAGULATION CONSULT NOTE - Initial Consult  Pharmacy Consult for warfarin Indication: atrial fibrillation  Allergies  Allergen Reactions  . Methotrexate Cough    Other reaction(s): Other (See Comments) Oral ulcers  . Tofacitinib Swelling    Other reaction(s): Other (See Comments) thrush    Patient Measurements: Height: 5' 3.5" (161.3 cm) Weight: 160 lb (72.576 kg) IBW/kg (Calculated) : 53.55 Heparin Dosing Weight:   Vital Signs: Temp: 98.1 F (36.7 C) (09/05 0709) Temp Source: Oral (09/05 0709) BP: 154/68 mmHg (09/05 0709) Pulse Rate: 61 (09/05 0709)  Labs:  Recent Labs  09/29/14 1455 09/29/14 2324 09/30/14 0724 10/01/14 0631  HGB 11.5*  --  11.2* 9.9*  HCT 36.4  --  34.9* 30.7*  PLT 252  --  236 215  LABPROT 25.4* 20.9* 16.9* 15.6*  INR 2.30 1.78 1.35 1.22  CREATININE 0.58  --  0.53 0.59    Estimated Creatinine Clearance: 54.2 mL/min (by C-G formula based on Cr of 0.59).   Medical History: Past Medical History  Diagnosis Date  . Hypertension   . Arthritis   . Osteoporosis   . Hyperlipidemia   . Atrial fibrillation   . Anemia   . Left rotator cuff tear     Medications:  Scheduled:  . benazepril  40 mg Oral Daily  . cefTRIAXone (ROCEPHIN)  IV  1 g Intravenous Q24H  . docusate sodium  100 mg Oral BID  . enoxaparin (LOVENOX) injection  40 mg Subcutaneous Q24H  . gemfibrozil  600 mg Oral Daily  . metoprolol tartrate  50 mg Oral 3 times per day  . pantoprazole  40 mg Oral BID  . phenazopyridine  100 mg Oral TID WC  . predniSONE  5 mg Oral Q breakfast  . Warfarin - Pharmacist Dosing Inpatient   Does not apply q1800    Assessment: Pt is a 81 year old female with a history of afib. Patient suffered a hip fracture and underwent a right hip unipolar hemiarthroplasty. Restarting warfarin therapy after surgery. PT INR this AM was 1.35, on admission she was therapeutic at 2.3. Pt to receive lovenox 40 q 24 hr  Goal of Therapy:  INR 2-3 Monitor  platelets by anticoagulation protocol: Yes   Plan:  Will continue patient home dose of warfarin 7mg  MWF and 6mg  all other days. Will check INR in the AM. Continue lovenox until INR therapeutic x 2  , RPh Clinical Pharmacist 10/01/2014,9:14 AM

## 2014-10-01 NOTE — Progress Notes (Signed)
Notified Dr Amado Coe of pt's complaint of shoulder pain. EKG ordered

## 2014-10-01 NOTE — Progress Notes (Signed)
  Subjective: 1 Day Post-Op Procedure(s) (LRB): ARTHROPLASTY BIPOLAR HIP (HEMIARTHROPLASTY) (Right) Patient reports pain as mild.   Patient seen in rounds with Dr. Joice Lofts. Patient is well, and has had no acute complaints or problems Plan is to go Rehab after hospital stay. Negative for chest pain and shortness of breath Fever: no Gastrointestinal: Negative for nausea and vomiting  Objective: Vital signs in last 24 hours: Temp:  [97.9 F (36.6 C)-100.8 F (38.2 C)] 98.4 F (36.9 C) (09/05 0426) Pulse Rate:  [41-136] 41 (09/05 0426) Resp:  [15-22] 22 (09/05 0426) BP: (136-176)/(57-97) 161/80 mmHg (09/05 0426) SpO2:  [91 %-100 %] 92 % (09/05 0426)  Intake/Output from previous day:  Intake/Output Summary (Last 24 hours) at 10/01/14 0615 Last data filed at 10/01/14 0431  Gross per 24 hour  Intake 2148.25 ml  Output   1535 ml  Net 613.25 ml    Intake/Output this shift: Total I/O In: 636.3 [I.V.:636.3] Out: 1075 [Urine:1075]  Labs:  Recent Labs  09/29/14 1455 09/30/14 0724  HGB 11.5* 11.2*    Recent Labs  09/29/14 1455 09/30/14 0724  WBC 9.5 11.0  RBC 4.41 4.21  HCT 36.4 34.9*  PLT 252 236    Recent Labs  09/29/14 1455 09/30/14 0724  NA 139 140  K 3.8 3.5  CL 108 109  CO2 25 25  BUN 18 17  CREATININE 0.58 0.53  GLUCOSE 98 97  CALCIUM 8.1* 8.2*    Recent Labs  09/29/14 2324 09/30/14 0724  INR 1.78 1.35     EXAM General - Patient is Alert and Oriented Extremity - Neurovascular intact Dorsiflexion/Plantar flexion intact Compartment soft Dressing/Incision - clean, dry, no drainage Motor Function - intact, moving foot and toes well on exam.   Past Medical History  Diagnosis Date  . Hypertension   . Arthritis   . Osteoporosis   . Hyperlipidemia   . Atrial fibrillation   . Anemia   . Left rotator cuff tear     Assessment/Plan: 1 Day Post-Op Procedure(s) (LRB): ARTHROPLASTY BIPOLAR HIP (HEMIARTHROPLASTY) (Right) Active Problems:  Hip fracture  Estimated body mass index is 27.89 kg/(m^2) as calculated from the following:   Height as of this encounter: 5' 3.5" (1.613 m).   Weight as of this encounter: 72.576 kg (160 lb). Advance diet Up with therapy  DVT Prophylaxis - Lovenox, Coumadin, Foot Pumps and TED hose Weight-Bearing as tolerated to right leg  Sonia Skeens, PA-C Orthopaedic Surgery 10/01/2014, 6:15 AM

## 2014-10-01 NOTE — Care Management Important Message (Signed)
Important Message  Patient Details  Name: Sonia Thompson MRN: [id number] Date of Birth: 10-14-1932   Medicare Important Message Given:  Yes-second notification given    Collie Siad, RN 10/01/2014, 11:20 AM

## 2014-10-01 NOTE — Progress Notes (Signed)
Pt complains of left chest tightness stating My A Fib " is kicking in , pt respiratory status regular , clear  Dr Hoyle Sauer paged

## 2014-10-01 NOTE — Care Management Note (Signed)
Case Management Note  Patient Details  Name: Sonia Thompson MRN: [id number] Date of Birth: [birth year]  Subjective/Objective:                  Met with patient and her sister Arbie Cookey) to discuss discharge planning. Patient wants to return home where she lives alone. This concerns her sister and she seemed to become agitated when i explained medical necessity of SNF vs home health. PT is pending. Patient states she has a front-wheeled walker at home which Arbie Cookey will bring to hospital for evaluation. Patient uses [facility] : [phone].   Action/Plan: List of home health agencies left with patient along with my contact card. RNCM will continue to follow.   Expected Discharge Date:  10/03/14               Expected Discharge Plan:     In-House Referral:     Discharge planning Services  CM Consult  Post Acute Care Choice:  Home Health Choice offered to:  Patient, Sibling  DME Arranged:  N/A DME Agency:     HH Arranged:  PT HH Agency:  [facility]  Status of Service:  In process, will continue to follow  Medicare Important Message Given:    Date Medicare IM Given:    Medicare IM give by:    Date Additional Medicare IM Given:    Additional Medicare Important Message give by:     If discussed at [place] of Stay Meetings, dates discussed:    Additional Comments:  Marshell Garfinkel, RN 10/01/2014, 10:39 AM

## 2014-10-02 ENCOUNTER — Encounter: Payer: Self-pay | Admitting: Surgery

## 2014-10-02 ENCOUNTER — Inpatient Hospital Stay
Admit: 2014-10-02 | Discharge: 2014-10-02 | Disposition: A | Discharge status: Home / Self Care | Payer: Medicare Other | Attending: Internal Medicine | Admitting: Internal Medicine

## 2014-10-02 LAB — MAGNESIUM: Magnesium: 2.4 mg/dL (ref 1.7–2.4)

## 2014-10-02 LAB — HEPARIN LEVEL (UNFRACTIONATED): HEPARIN UNFRACTIONATED: 0.22 [IU]/mL — AB (ref 0.30–0.70)

## 2014-10-02 LAB — BASIC METABOLIC PANEL
Anion gap: 5 (ref 5–15)
BUN: 19 mg/dL (ref 6–20)
CALCIUM: 7.7 mg/dL — AB (ref 8.9–10.3)
CO2: 24 mmol/L (ref 22–32)
CREATININE: 0.5 mg/dL (ref 0.44–1.00)
Chloride: 111 mmol/L (ref 101–111)
GFR calc non Af Amer: >60 mL/min (ref >60)
GLUCOSE: 105 mg/dL — AB (ref 65–99)
Potassium: 4 mmol/L (ref 3.5–5.1)
Sodium: 140 mmol/L (ref 135–145)

## 2014-10-02 LAB — CBC
HEMATOCRIT: 29.1 % — AB (ref 35.0–47.0)
HEMOGLOBIN: 9.4 g/dL — AB (ref 12.0–16.0)
MCH: 27 pg (ref 26.0–34.0)
MCHC: 32.4 g/dL (ref 32.0–36.0)
MCV: 83.5 fL (ref 80.0–100.0)
Platelets: 211 10*3/uL (ref 150–440)
RBC: 3.49 MIL/uL — ABNORMAL LOW (ref 3.80–5.20)
RDW: 15.4 % — ABNORMAL HIGH (ref 11.5–14.5)
WBC: 11.8 10*3/uL — AB (ref 3.6–11.0)

## 2014-10-02 LAB — TROPONIN I
TROPONIN I: 1.51 ng/mL — AB (ref <0.031)
TROPONIN I: 3.05 ng/mL — AB (ref <0.031)
Troponin I: 3.01 ng/mL — ABNORMAL HIGH (ref <0.031)

## 2014-10-02 LAB — URINE CULTURE: Special Requests: NORMAL

## 2014-10-02 LAB — APTT: aPTT: 34 seconds (ref 24–36)

## 2014-10-02 LAB — PROTIME-INR
INR: 3.23
PROTHROMBIN TIME: 33 s — AB (ref 11.4–15.0)

## 2014-10-02 MED ORDER — HEPARIN BOLUS VIA INFUSION
1000.0000 [IU] | Freq: Once | INTRAVENOUS | Status: AC
  Administered 2014-10-02: 1000 [IU] via INTRAVENOUS
  Filled 2014-10-02: qty 1000

## 2014-10-02 MED ORDER — HEPARIN BOLUS VIA INFUSION
4000.0000 [IU] | Freq: Once | INTRAVENOUS | Status: DC
  Filled 2014-10-02: qty 4000

## 2014-10-02 MED ORDER — CEPHALEXIN 500 MG PO CAPS
500.0000 mg | ORAL_CAPSULE | Freq: Two times a day (BID) | ORAL | Status: DC
  Administered 2014-10-02 – 2014-10-03 (×3): 500 mg via ORAL
  Filled 2014-10-02 (×3): qty 1

## 2014-10-02 MED ORDER — HEPARIN (PORCINE) IN NACL 100-0.45 UNIT/ML-% IJ SOLN
1200.0000 [IU]/h | INTRAMUSCULAR | Status: DC
  Administered 2014-10-02: 800 [IU]/h via INTRAVENOUS
  Filled 2014-10-02 (×4): qty 250

## 2014-10-02 NOTE — Progress Notes (Signed)
*  PRELIMINARY RESULTS* Echocardiogram 2D Echocardiogram has been performed.  Georgann Housekeeper Hege 10/02/2014, 10:35 AM

## 2014-10-02 NOTE — Progress Notes (Addendum)
ANTICOAGULATION CONSULT NOTE - Initial Consult  Pharmacy Consult for heparin drip Indication: ACS/STEMI  Allergies  Allergen Reactions  . Methotrexate Cough    Other reaction(s): Other (See Comments) Oral ulcers  . Tofacitinib Swelling    Other reaction(s): Other (See Comments) thrush    Patient Measurements: Height: 5' 3.5" (161.3 cm) Weight: 175 lb 11.2 oz (79.697 kg) IBW/kg (Calculated) : 53.55 Heparin Dosing Weight: 68kg  Vital Signs: Temp: 97.8 F (36.6 C) (09/06 0341) Temp Source: Oral (09/06 0145) BP: 154/97 mmHg (09/06 0341) Pulse Rate: 103 (09/06 0341)  Labs:  Recent Labs  09/30/14 0724 10/01/14 0631 10/01/14 1904 10/02/14 0033 10/02/14 0339  HGB 11.2* 9.9*  --  9.4*  --   HCT 34.9* 30.7*  --  29.1*  --   PLT 236 215  --  211  --   APTT  --   --   --   --  34  LABPROT 16.9* 15.6*  --   --  33.0*  INR 1.35 1.22  --   --  3.23  CREATININE 0.53 0.59  --  0.50  --   TROPONINI  --   --  0.58* 1.51*  --     Estimated Creatinine Clearance: 56.7 mL/min (by C-G formula based on Cr of 0.5).   Medical History: Past Medical History  Diagnosis Date  . Hypertension   . Arthritis   . Osteoporosis   . Hyperlipidemia   . Atrial fibrillation   . Anemia   . Left rotator cuff tear     Medications:    Assessment: hgb 9.4  plt 211 aPTT 34 INR 3.23  Goal of Therapy:  Heparin level 0.3-0.7 units/ml Monitor platelets by anticoagulation protocol: Yes   Plan:  Pt had been on Lovenox 40 mg daily. No bolus per discussion with MD. Heparin drip started at 800 units/hr. First anti-Xa 8 hors after start of drip.  Skylur Fuston S 10/02/2014,5:43 AM

## 2014-10-02 NOTE — Progress Notes (Signed)
PT Cancellation Note  Patient Details Name: Sonia Thompson MRN: [id number] DOB: 1932/08/07   Cancelled Treatment:    Reason Eval/Treat Not Completed: Patient declined, no reason specified. Treatment attempted this pm. Patient refuses noting she is in unbearable, burning pain in her kidneys. Spoke with nursing regarding who felt it was best to hold PT today. Will re attempt PT tomorrow.    Elsie Stain Bishop 10/02/2014, 1:50 PM

## 2014-10-02 NOTE — Progress Notes (Addendum)
Elevated troponin noted.  Pt still with chest pain radiating to L hand.  Will initiate heparin gtt and transfer to telemetry.  Discontinued bridging Lovenox and warfarin

## 2014-10-02 NOTE — Progress Notes (Signed)
  Subjective: 2 Days Post-Op Procedure(s) (LRB): ARTHROPLASTY BIPOLAR HIP (HEMIARTHROPLASTY) (Right) Patient reports pain as 4 on 0-10 scale.   Patient is having problems with chest pain. Plan is to go Skilled nursing facility after hospital stay. Negative for SOB.  Positive for chest pain, cardiology consult placed. Fever: no Gastrointestinal:Negative for nausea and vomiting  Objective: Vital signs in last 24 hours: Temp:  [97.8 F (36.6 C)-98.9 F (37.2 C)] 98.2 F (36.8 C) (09/06 1114) Pulse Rate:  [56-103] 56 (09/06 1114) Resp:  [16-20] 18 (09/06 1114) BP: (139-188)/(56-103) 153/70 mmHg (09/06 1114) SpO2:  [93 %-99 %] 95 % (09/06 1114) Weight:  [79.697 kg (175 lb 11.2 oz)] 79.697 kg (175 lb 11.2 oz) (09/06 0341)  Intake/Output from previous day:  Intake/Output Summary (Last 24 hours) at 10/02/14 1312 Last data filed at 10/02/14 0733  Gross per 24 hour  Intake    360 ml  Output      0 ml  Net    360 ml    Intake/Output this shift: Total I/O In: 240 [P.O.:240] Out: -   Labs:  Recent Labs  09/29/14 1455 09/30/14 0724 10/01/14 0631 10/02/14 0033  HGB 11.5* 11.2* 9.9* 9.4*    Recent Labs  10/01/14 0631 10/02/14 0033  WBC 11.6* 11.8*  RBC 3.73* 3.49*  HCT 30.7* 29.1*  PLT 215 211    Recent Labs  10/01/14 0631 10/02/14 0033  NA 139 140  K 4.1 4.0  CL 109 111  CO2 26 24  BUN 15 19  CREATININE 0.59 0.50  GLUCOSE 135* 105*  CALCIUM 8.0* 7.7*    Recent Labs  10/01/14 0631 10/02/14 0339  INR 1.22 3.23     EXAM General - Patient is Alert, Appropriate and Oriented Extremity - Neurologically intact ABD soft Sensation intact distally Dorsiflexion/Plantar flexion intact Incision: dressing C/D/I No cellulitis present Dressing/Incision - clean, dry, no drainage Motor Function - intact, moving foot and toes well on exam.    Past Medical History  Diagnosis Date  . Hypertension   . Arthritis   . Osteoporosis   . Hyperlipidemia   . Atrial  fibrillation   . Anemia   . Left rotator cuff tear     Assessment/Plan: 2 Days Post-Op Procedure(s) (LRB): ARTHROPLASTY BIPOLAR HIP (HEMIARTHROPLASTY) (Right) Active Problems:   Hip fracture  Estimated body mass index is 30.63 kg/(m^2) as calculated from the following:   Height as of this encounter: 5' 3.5" (1.613 m).   Weight as of this encounter: 79.697 kg (175 lb 11.2 oz). Advance diet Up with therapy   Pt recently complaining for chest pain.  Cardiology consulted, 2D ECHO performed today.  Pt states that her chest pain is better than previously. Pt being treated for acute cystitis.  Pt is taking po keflex and pyridium. Pt has had a BM and is not complaining of any abdominal pain.  DVT Prophylaxis - Foot Pumps, TED hose and Heparin. Weight-Bearing as tolerated to right leg  J. Horris Latino, PA-C [facility] 10/02/2014, 1:12 PM

## 2014-10-02 NOTE — Progress Notes (Addendum)
[facility] - [place] at [facility]   PATIENT NAME: Sonia Thompson    MR#:  [id number]  DATE OF BIRTH:  1933/01/18  SUBJECTIVE:   C/o burning in lower abodmen REVIEW OF SYSTEMS:   Review of Systems  Constitutional: Negative for fever, chills and weight loss.  HENT: Negative for ear discharge, ear pain and nosebleeds.   Eyes: Negative for blurred vision, pain and discharge.  Respiratory: Negative for sputum production, shortness of breath, wheezing and stridor.   Cardiovascular: Negative for chest pain, palpitations, orthopnea and PND.  Gastrointestinal: Negative for nausea, vomiting, abdominal pain and diarrhea.  Genitourinary: Positive for dysuria. Negative for urgency and frequency.  Musculoskeletal: Negative for back pain and joint pain.  Neurological: Positive for weakness. Negative for sensory change, speech change and focal weakness.  Psychiatric/Behavioral: Negative for depression. The patient is not nervous/anxious.   All other systems reviewed and are negative.  Tolerating Diet:yes Tolerating PT: yes (rec SNF)  DRUG ALLERGIES:   Allergies  Allergen Reactions  . Methotrexate Cough    Other reaction(s): Other (See Comments) Oral ulcers  . Tofacitinib Swelling    Other reaction(s): Other (See Comments) thrush    VITALS:  Blood pressure 154/97, pulse 103, temperature 97.8 F (36.6 C), temperature source Oral, resp. rate 16, height 5' 3.5" (1.613 m), weight 79.697 kg (175 lb 11.2 oz), SpO2 93 %.  PHYSICAL EXAMINATION:   Physical Exam  GENERAL:  81 y.o.-year-old patient lying in the bed with no acute distress.  EYES: Pupils equal, round, reactive to light and accommodation. No scleral icterus. Extraocular muscles intact.  HEENT: Head atraumatic, normocephalic. Oropharynx and nasopharynx clear.  NECK:  Supple, no jugular venous distention. No thyroid enlargement, no tenderness.  LUNGS: Normal breath sounds bilaterally, no wheezing, rales,  rhonchi. No use of accessory muscles of respiration.  CARDIOVASCULAR: S1, S2 normal. No murmurs, rubs, or gallops. Irregular rhythm ABDOMEN: Soft, nontender, nondistended. Bowel sounds present. No organomegaly or mass.  EXTREMITIES: No cyanosis, clubbing or edema b/l.   Surgical dressing+ right hip NEUROLOGIC: Cranial nerves II through XII are intact. No focal Motor or sensory deficits b/l.   PSYCHIATRIC: patient is alert and oriented x 3.  SKIN: No obvious rash, lesion, or ulcer.    LABORATORY PANEL:   CBC  Recent Labs Lab 10/02/14 0033  WBC 11.8*  HGB 9.4*  HCT 29.1*  PLT 211    Chemistries   Recent Labs Lab 10/02/14 0033  NA 140  K 4.0  CL 111  CO2 24  GLUCOSE 105*  BUN 19  CREATININE 0.50  CALCIUM 7.7*  MG 2.4    Cardiac Enzymes  Recent Labs Lab 10/02/14 0646  TROPONINI 3.05*    RADIOLOGY:  Dg Hip Port Unilat With Pelvis 1v Right  09/30/2014   CLINICAL DATA:  Right hip hemiarthroplasty.  EXAM: DG HIP (WITH OR WITHOUT PELVIS) 1V PORT RIGHT  COMPARISON:  09/29/2014 radiograph  FINDINGS: Right hip hemiarthroplasty identified without complicating features.  There is no evidence of acute fracture or subluxation.  IMPRESSION: Right hip hemiarthroplasty without complicating features.   Electronically Signed   By: Harmon Pier M.D.   On: 09/30/2014 13:36     ASSESSMENT AND PLAN:  Kresha Abelson is a 81 y.o. female with a known history of rheumatoid arthritis, atrial fibrillation on Coumadin and essential hypertension who presents with above complaint. Patient was at the refrigerator this morning using her walker. She turned around and unfortunately suffered a mechanical fall on the  right side. She came to the ER for further evaluation as she was having right hip pain  * POD#2 with right femoral neck fracture/ s/p surgery on sept 3rd PT Recommends SNF  *Acute NQWMI -came in with troponin of 0.58--->3.01 -now on IV heparin gtt -d/w dr Gwen Pounds to see pt -echo  if cardiology wants it -on BB,ace,statins  *Acute Cystitis -po keflex and pyridium  *chronic afib -on BB, HR stable -off coumadin due to pt now being on heparin gtt  *h/o Rheumatoid arthritis -on po steroids. Received stress doses of steroids  *h/o HL on gemfibrozil  Case discussed with Care Management/Social Worker. Management plans discussed with the patient and  in agreement.  CODE STATUS: full  DVT Prophylaxis: heparin  TOTAL TIME TAKING CARE OF THIS PATIENT: 35inutes.  >50% time spent on counselling and coordination of care  POSSIBLE D/C IN *2-3S, DEPENDING ON CLINICAL CONDITION.   Talvin Christianson M.D on 10/02/2014 at 8:14 AM  Between 7am to 6pm - Pager - [phone]  After 6pm go to [email or web address] - password [email or web address] [facility]  [facility]  Office  [phone]  CC: Primary care physician; Elizabeth Sauer, MD

## 2014-10-02 NOTE — Progress Notes (Signed)
MD paged for 2nd troponin level 1.51. Dr. Sheryle Hail will review for possible transfer to CCU. Will cont to monitor.

## 2014-10-02 NOTE — Progress Notes (Signed)
PT Cancellation Note  Patient Details Name: Sonia Thompson MRN: [id number] DOB: [birth year]   Cancelled Treatment:    Reason Eval/Treat Not Completed:  (See PT note for further details) PT hold this AM secondary to pt c/o pain in R hip. Nurse stated that pt not appropriate for therapy this AM, but will likely be ready this afternoon. Will attempt at later time/date.    Benna Dunks 10/02/2014, 12:00 PM

## 2014-10-02 NOTE — Clinical Social Work Note (Signed)
Clinical Social Work Assessment  Patient Details  Name: Sonia Thompson MRN: [id number] Date of Birth: 30-May-1932  Date of referral:  10/02/14               Reason for consult:  Facility Placement                Permission sought to share information with:  Facility Medical sales representative, Family Supports Permission granted to share information::  Yes, Verbal Permission Granted  Name::        Agency::  SNF facilities   Relationship::     Contact Information:     Housing/Transportation Living arrangements for the past 2 months:  Single Family Home Source of Information:  Patient Patient Interpreter Needed:  None Criminal Activity/Legal Involvement Pertinent to Current Situation/Hospitalization:  No - Comment as needed Significant Relationships:  Siblings Lives with:  Self Do you feel safe going back to the place where you live?  No Need for family participation in patient care:  No (Coment)  Care giving concerns:  Current concerns are being able to complete ADL's once at home.  She is in agreement with SNF placement if needed   Social Worker assessment / plan:  CSW spoke to pt.  She was Ox4.  She stated that she lived alone and was independent before coming to the hospital.  Her husband passed 3 years ago.  She is in agreement with SNF placement at this time.  CSW will follow up once offers have been received.  Employment status:  Retired, Disabled (Comment on whether or not currently receiving Disability) Insurance information:  Medicare PT Recommendations:  Skilled Nursing Facility Information / Referral to community resources:  Skilled Nursing Facility  Patient/Family's Response to care:  Pt was in agreement with SNF DC   Patient/Family's Understanding of and Emotional Response to Diagnosis, Current Treatment, and Prognosis:  Pt verbalized her understanding of DC to SNF.   Emotional Assessment Appearance:  Appears younger than stated age Attitude/Demeanor/Rapport:    (pleasent) Affect (typically observed):  Appropriate Orientation:  Oriented to Self, Oriented to Place, Oriented to  Time, Oriented to Situation Alcohol / Substance use:  Never Used Psych involvement (Current and /or in the community):  No (Comment)  Discharge Needs  Concerns to be addressed:  Care Coordination Readmission within the last 30 days:  No Current discharge risk:  Physical Impairment Barriers to Discharge:  No Barriers Identified   Chauncy Passy, LCSW 10/02/2014, 2:21 PM

## 2014-10-02 NOTE — Progress Notes (Signed)
Pt was transferred to unit. No c/o chest pain at this time. Heparin dripp started as ordered. Pt is A&O.

## 2014-10-02 NOTE — Progress Notes (Signed)
AAOX3 voice no complaints. Calling light within reach

## 2014-10-02 NOTE — Care Management (Signed)
Transferred to 2A from ortho after experiencing chest pain.  Troponin 3.0- cardiology has documented nstemi.  Initial PT evaluation recommendation is skilled nursing facility placement.  CSW aware

## 2014-10-02 NOTE — Progress Notes (Signed)
ANTICOAGULATION CONSULT NOTE - Follow-Up Consult  Pharmacy Consult for heparin drip Indication: ACS/STEMI  Allergies  Allergen Reactions  . Methotrexate Cough    Other reaction(s): Other (See Comments) Oral ulcers  . Tofacitinib Swelling    Other reaction(s): Other (See Comments) thrush    Patient Measurements: Height: 5' 3.5" (161.3 cm) Weight: 175 lb 11.2 oz (79.697 kg) IBW/kg (Calculated) : 53.55 Heparin Dosing Weight: 68kg  Vital Signs: Temp: 98.2 F (36.8 C) (09/06 1114) Temp Source: Oral (09/06 1114) BP: 153/70 mmHg (09/06 1114) Pulse Rate: 56 (09/06 1114)  Labs:  Recent Labs  09/30/14 0724 10/01/14 0631 10/01/14 1904 10/02/14 0033 10/02/14 0339 10/02/14 0646 10/02/14 1340  HGB 11.2* 9.9*  --  9.4*  --   --   --   HCT 34.9* 30.7*  --  29.1*  --   --   --   PLT 236 215  --  211  --   --   --   APTT  --   --   --   --  34  --   --   LABPROT 16.9* 15.6*  --   --  33.0*  --   --   INR 1.35 1.22  --   --  3.23  --   --   HEPARINUNFRC  --   --   --   --   --   --  0.22*  CREATININE 0.53 0.59  --  0.50  --   --   --   TROPONINI  --   --  0.58* 1.51*  --  3.05*  --     Estimated Creatinine Clearance: 56.7 mL/min (by C-G formula based on Cr of 0.5).   Medical History: Past Medical History  Diagnosis Date  . Hypertension   . Arthritis   . Osteoporosis   . Hyperlipidemia   . Atrial fibrillation   . Anemia   . Left rotator cuff tear     Medications:    Assessment: hgb 9.4  plt 211 Heparin level (anti-Xa) 0.22 units/mL Current heparin rate 800 units/hr; no bleeding noted.   Goal of Therapy:  Heparin level 0.3-0.7 units/ml Monitor platelets by anticoagulation protocol: Yes   Plan:  Pt had been on Lovenox 40 mg daily, last dose 9/5 PM. As heparin level is subtherapeutic will give bolus of 1000 units (15 units/kg) and increase rate to 950 units/hr per heparin dosing protocol. Next anti-Xa scheduled in 8 hours at 2230. CBC in the AM.  Jacqualyn Posey, PharmD 10/02/2014,2:38 PM
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
## 2014-10-02 NOTE — Consult Note (Signed)
[facility] Consultation Note  Patient ID: Sonia Thompson, MRN: [id number], DOB/AGE: Feb 05, 1932 81 y.o. Admit date: 09/29/2014   Date of Consult: 10/02/2014 Primary Physician: Elizabeth Sauer, MD Primary Cardiologist: Gwen Pounds  Chief Complaint:  Chief Complaint  Patient presents with  . Fall  . Hip Injury   Reason for Consult: acute non-ST elevation myocardial infarction  HPI: 81 y.o. female with known paroxysmal nonvalvular atrial fibrillation with occasional episodes of palpitations and irregularity on appropriate medication management including metoprolol and warfarin. The patient had a fall and broke her hip and then had an fixing of her hip with surgery. After surgery she had had an episodes of chest pressure and pain radiating both arms for several hours. After this she has now had full pain relief but now has had a troponin elevation 3.0 consistent with non-ST elevation myocardial infarction. Prior to this she has had episodes of chest pressure with and without physical activity increasing in frequency and intensity over the last several weeks. The patient has had appropriate medication management for further risk reduction including metoprolol. Hypertension is well controlled on current medical regimen. Current EKG has shown normal sinus rhythm without EKG changes  Past Medical History  Diagnosis Date  . Hypertension   . Arthritis   . Osteoporosis   . Hyperlipidemia   . Atrial fibrillation   . Anemia   . Left rotator cuff tear       Surgical History:  Past Surgical History  Procedure Laterality Date  . Appendectomy    . Cataract extraction, bilateral    . Bilateral carpal tunnel release       Home Meds: Prior to Admission medications   Medication Sig Start Date End Date Taking? Authorizing Provider  Adalimumab 40 MG/0.8ML PNKT Inject 40 mg into the skin every 14 (fourteen) days.  06/07/14  Yes Historical Provider, MD  alendronate (FOSAMAX) 70 MG tablet 1  tablet once a week. Take on Saturdays. 12/25/13  Yes Historical Provider, MD  benazepril (LOTENSIN) 40 MG tablet TAKE 1 TABLET BY MOUTH ONCE A DAY 01/31/14  Yes Historical Provider, MD  esomeprazole (NEXIUM) 20 MG capsule Take 20 mg by mouth daily at 12 noon.   Yes Historical Provider, MD  gemfibrozil (LOPID) 600 MG tablet TAKE 1 TABLET BY MOUTH ONCE DAILY 07/09/14  Yes Duanne Limerick, MD  metoprolol (LOPRESSOR) 50 MG tablet TAKE 1 TABLET BY MOUTH TWICE A DAY 08/21/14  Yes Historical Provider, MD  Omega-3 Fatty Acids (FISH OIL) 1000 MG CAPS Take 1,000 mg by mouth daily.   Yes Historical Provider, MD  predniSONE (DELTASONE) 5 MG tablet TAKE 1 TABLET (5 MG TOTAL) BY MOUTH ONCE DAILY. 03/08/14  Yes Historical Provider, MD  warfarin (COUMADIN) 1 MG tablet Take 1 mg by mouth every Monday, Wednesday, and Friday. Take along with 6mg  tablet to equal 7mg  total.   Yes Historical Provider, MD  warfarin (COUMADIN) 6 MG tablet Take 6 mg by mouth daily.   Yes Historical Provider, MD    Inpatient Medications:  . aspirin EC  81 mg Oral Daily  . benazepril  40 mg Oral Daily  . cephALEXin  500 mg Oral Q12H  . docusate sodium  100 mg Oral BID  . gemfibrozil  600 mg Oral Daily  . metoprolol tartrate  50 mg Oral 3 times per day  . pantoprazole  40 mg Oral BID  . phenazopyridine  100 mg Oral TID WC  . predniSONE  5 mg Oral Q breakfast   .  dextrose 5 % and 0.9 % NaCl with KCl 20 mEq/L 75 mL/hr at 10/02/14 0346  . heparin 800 Units/hr (10/02/14 6195)    Allergies:  Allergies  Allergen Reactions  . Methotrexate Cough    Other reaction(s): Other (See Comments) Oral ulcers  . Tofacitinib Swelling    Other reaction(s): Other (See Comments) thrush    Social History   Social History  . Marital Status: Married    Spouse Name: N/A  . Number of Children: N/A  . Years of Education: N/A   Occupational History  . Not on file.   Social History Main Topics  . Smoking status: Never Smoker   . Smokeless  tobacco: Never Used  . Alcohol Use: No  . Drug Use: No  . Sexual Activity: Not on file   Other Topics Concern  . Not on file   Social History Narrative     History reviewed. No pertinent family history.   Review of Systems Positive for chest pain and pressure and limb pain for recent hip fracture Negative for: General:  chills, fever, night sweats or weight changes.  Cardiovascular: PND orthopnea syncope dizziness  Dermatological skin lesions rashes Respiratory: Cough congestion Urologic: Frequent urination urination at night and hematuria Abdominal: negative for nausea, vomiting, diarrhea, bright red blood per rectum, melena, or hematemesis Neurologic: negative for visual changes, and/or hearing changes  All other systems reviewed and are otherwise negative except as noted above.  Labs:  Recent Labs  10/01/14 1904 10/02/14 0033 10/02/14 0646  TROPONINI 0.58* 1.51* 3.05*   Lab Results  Component Value Date   WBC 11.8* 10/02/2014   HGB 9.4* 10/02/2014   HCT 29.1* 10/02/2014   MCV 83.5 10/02/2014   PLT 211 10/02/2014    Recent Labs Lab 10/02/14 0033  NA 140  K 4.0  CL 111  CO2 24  BUN 19  CREATININE 0.50  CALCIUM 7.7*  GLUCOSE 105*   No results found for: CHOL, HDL, LDLCALC, TRIG No results found for: DDIMER  Radiology/Studies:  Dg Chest 1 View  09/29/2014   CLINICAL DATA:  Two falls within the last week. Bilateral hip pain. History of hypertension, atrial fibrillation.  EXAM: CHEST  1 VIEW  COMPARISON:  12/15/2013  FINDINGS: Heart is mildly enlarged. The lungs are free of focal consolidations and pleural effusions. Mild perihilar peribronchial thickening. No pulmonary edema. No pneumothorax or acute displaced fractures. Deformity of the right proximal humerus is consistent with old fracture.  IMPRESSION: 1. Cardiomegaly without pulmonary edema. 2. Bronchitic changes.  No focal acute pulmonary abnormality.   Electronically Signed   By: Norva Pavlov M.D.    On: 09/29/2014 15:03   Dg Pelvis 1-2 Views  09/29/2014   CLINICAL DATA:  Fall today with acute right hip pain. Initial encounter.  EXAM: PELVIS - 1-2 VIEW  COMPARISON:  None.  FINDINGS: A right femoral neck fracture is identified without subluxation or dislocation.  Diffuse osteopenia is noted.  Degenerative changes in the lower lumbar spine are present.  No suspicious focal bony lesions are noted.  IMPRESSION: Right femoral neck fracture.   Electronically Signed   By: Harmon Pier M.D.   On: 09/29/2014 14:49   Dg Hip Port Unilat With Pelvis 1v Right  09/30/2014   CLINICAL DATA:  Right hip hemiarthroplasty.  EXAM: DG HIP (WITH OR WITHOUT PELVIS) 1V PORT RIGHT  COMPARISON:  09/29/2014 radiograph  FINDINGS: Right hip hemiarthroplasty identified without complicating features.  There is no evidence of acute fracture or  subluxation.  IMPRESSION: Right hip hemiarthroplasty without complicating features.   Electronically Signed   By: Harmon Pier M.D.   On: 09/30/2014 13:36   Dg Femur Min 2 Views Left  09/29/2014   CLINICAL DATA:  Acute fall today with left leg pain. Initial encounter.  EXAM: LEFT FEMUR 2 VIEWS  COMPARISON:  None.  FINDINGS: There is no evidence of acute fracture, subluxation or dislocation.  Mild degenerative changes within the hip and knee noted.  No focal bony lesions are identified.  IMPRESSION: No evidence of acute bony abnormality.   Electronically Signed   By: Harmon Pier M.D.   On: 09/29/2014 14:48   Dg Femur, Min 2 Views Right  09/29/2014   CLINICAL DATA:  Patient from home. Larey Seat this morning after tripping over her walker. Shortening of the right leg.  EXAM: RIGHT FEMUR 2 VIEWS  COMPARISON:  None.  FINDINGS: There is an acute impacted fracture of the right subcapital femoral neck, associated with varus angulation and foreshortening of femur. No dislocation. Distal femur is intact.  IMPRESSION: Subcapital femoral neck fracture.   Electronically Signed   By: Norva Pavlov M.D.   On:  09/29/2014 14:47    EKG: Normal sinus rhythm with nonspecific ST and T-wave changes  Weights: Filed Weights   09/29/14 1333 10/02/14 0341  Weight: 160 lb (72.576 kg) 175 lb 11.2 oz (79.697 kg)     Physical Exam: Blood pressure 154/97, pulse 103, temperature 97.8 F (36.6 C), temperature source Oral, resp. rate 16, height 5' 3.5" (1.613 m), weight 175 lb 11.2 oz (79.697 kg), SpO2 93 %. Body mass index is 30.63 kg/(m^2). General: Well developed, well nourished, in no acute distress. Head eyes ears nose throat: Normocephalic, atraumatic, sclera non-icteric, no xanthomas, nares are without discharge. No apparent thyromegaly and/or mass  Lungs: Normal respiratory effort.  no wheezes, no rales, few rhonchi.  Heart: RRR with normal S1 soft S2. 3 out of 6 right upper sternal border murmur gallop, no rub, PMI is normal size and placement, carotid upstroke normal with bruit, jugular venous pressure is normal Abdomen: Soft, non-tender, non-distended with normoactive bowel sounds. No hepatomegaly. No rebound/guarding. No obvious abdominal masses. Abdominal aorta is normal size without bruit Extremities: Trace edema. no cyanosis, no clubbing, no ulcers  Peripheral : 2+ bilateral upper extremity pulses, 2+ bilateral femoral pulses, 2+ bilateral dorsal pedal pulse Neuro: Alert and oriented. No facial asymmetry. No focal deficit. Moves all extremities spontaneously. Musculoskeletal: Normal muscle tone without kyphosis Psych:  Responds to questions appropriately with a normal affect.    Assessment: 81 year old female with paroxysmal nonvalvular atrial fibrillation with essential hypertension and acute non-ST elevation myocardial infarction after hip surgery with precursor symptoms of chest pressure over the last several weeks consistent with possible coronary arteries disease3  Plan: 1. Continue heparin for further risk reduction in stroke with atrial fibrillation as well as reduction and further  cardiovascular event including the possibility of coronary artery disease 2. Toprol for heart rate control of atrial fibrillation maintenance of normal sinus rhythm as well as treatment of myocardial infarction 3. Echocardiogram for LV systolic dysfunction valvular heart disease contributing to above with known aortic valve stenosis 4. Further consideration of cardiac catheterization for assessment of coronary anatomy and further treatment thereof is necessary if further chest discomfort occurs. Patient understands risk and benefits. This includes a possibility of death stroke heart attack infection bleeding or blood clot. Patient is at low risk for conscious sedation  Signed, Zacarias Pontes.D.  [facility] 10/02/2014, 8:17 AM

## 2014-10-03 ENCOUNTER — Encounter: Payer: Self-pay | Admitting: *Deleted

## 2014-10-03 ENCOUNTER — Encounter
Admission: EM | Disposition: A | Discharge status: Other Acute Inpatient Hospital | Payer: Self-pay | Source: Home / Self Care | Attending: Internal Medicine

## 2014-10-03 ENCOUNTER — Ambulatory Visit (HOSPITAL_COMMUNITY)
Admission: AD | Admit: 2014-10-03 | Discharge: 2014-10-03 | Disposition: A | Discharge status: Home / Self Care | Payer: Medicare Other | Source: Other Acute Inpatient Hospital | Attending: Internal Medicine | Admitting: Internal Medicine

## 2014-10-03 DIAGNOSIS — Z7982 Long term (current) use of aspirin: Secondary | ICD-10-CM | POA: Diagnosis not present

## 2014-10-03 DIAGNOSIS — I6529 Occlusion and stenosis of unspecified carotid artery: Secondary | ICD-10-CM | POA: Diagnosis present

## 2014-10-03 DIAGNOSIS — I119 Hypertensive heart disease without heart failure: Secondary | ICD-10-CM | POA: Diagnosis not present

## 2014-10-03 DIAGNOSIS — I63411 Cerebral infarction due to embolism of right middle cerebral artery: Secondary | ICD-10-CM | POA: Diagnosis not present

## 2014-10-03 DIAGNOSIS — E222 Syndrome of inappropriate secretion of antidiuretic hormone: Secondary | ICD-10-CM | POA: Diagnosis not present

## 2014-10-03 DIAGNOSIS — S72009A Fracture of unspecified part of neck of unspecified femur, initial encounter for closed fracture: Secondary | ICD-10-CM | POA: Diagnosis not present

## 2014-10-03 DIAGNOSIS — Z823 Family history of stroke: Secondary | ICD-10-CM | POA: Diagnosis not present

## 2014-10-03 DIAGNOSIS — I480 Paroxysmal atrial fibrillation: Secondary | ICD-10-CM | POA: Diagnosis present

## 2014-10-03 DIAGNOSIS — I700 Atherosclerosis of aorta: Secondary | ICD-10-CM | POA: Diagnosis not present

## 2014-10-03 DIAGNOSIS — S72001A Fracture of unspecified part of neck of right femur, initial encounter for closed fracture: Secondary | ICD-10-CM | POA: Diagnosis not present

## 2014-10-03 DIAGNOSIS — M6281 Muscle weakness (generalized): Secondary | ICD-10-CM | POA: Diagnosis not present

## 2014-10-03 DIAGNOSIS — I442 Atrioventricular block, complete: Secondary | ICD-10-CM | POA: Diagnosis present

## 2014-10-03 DIAGNOSIS — I639 Cerebral infarction, unspecified: Secondary | ICD-10-CM | POA: Diagnosis not present

## 2014-10-03 DIAGNOSIS — J9811 Atelectasis: Secondary | ICD-10-CM | POA: Diagnosis not present

## 2014-10-03 DIAGNOSIS — I4891 Unspecified atrial fibrillation: Secondary | ICD-10-CM | POA: Diagnosis not present

## 2014-10-03 DIAGNOSIS — R079 Chest pain, unspecified: Secondary | ICD-10-CM | POA: Diagnosis not present

## 2014-10-03 DIAGNOSIS — I340 Nonrheumatic mitral (valve) insufficiency: Secondary | ICD-10-CM | POA: Diagnosis present

## 2014-10-03 DIAGNOSIS — N390 Urinary tract infection, site not specified: Secondary | ICD-10-CM | POA: Diagnosis not present

## 2014-10-03 DIAGNOSIS — I214 Non-ST elevation (NSTEMI) myocardial infarction: Secondary | ICD-10-CM | POA: Insufficient documentation

## 2014-10-03 DIAGNOSIS — I240 Acute coronary thrombosis not resulting in myocardial infarction: Secondary | ICD-10-CM | POA: Diagnosis not present

## 2014-10-03 DIAGNOSIS — Z8673 Personal history of transient ischemic attack (TIA), and cerebral infarction without residual deficits: Secondary | ICD-10-CM | POA: Diagnosis not present

## 2014-10-03 DIAGNOSIS — I25119 Atherosclerotic heart disease of native coronary artery with unspecified angina pectoris: Secondary | ICD-10-CM | POA: Diagnosis present

## 2014-10-03 DIAGNOSIS — Z951 Presence of aortocoronary bypass graft: Secondary | ICD-10-CM | POA: Diagnosis not present

## 2014-10-03 DIAGNOSIS — M6250 Muscle wasting and atrophy, not elsewhere classified, unspecified site: Secondary | ICD-10-CM | POA: Diagnosis not present

## 2014-10-03 DIAGNOSIS — E782 Mixed hyperlipidemia: Secondary | ICD-10-CM | POA: Diagnosis present

## 2014-10-03 DIAGNOSIS — I63511 Cerebral infarction due to unspecified occlusion or stenosis of right middle cerebral artery: Secondary | ICD-10-CM | POA: Diagnosis not present

## 2014-10-03 DIAGNOSIS — D62 Acute posthemorrhagic anemia: Secondary | ICD-10-CM | POA: Diagnosis not present

## 2014-10-03 DIAGNOSIS — I2510 Atherosclerotic heart disease of native coronary artery without angina pectoris: Secondary | ICD-10-CM | POA: Diagnosis not present

## 2014-10-03 DIAGNOSIS — K219 Gastro-esophageal reflux disease without esophagitis: Secondary | ICD-10-CM | POA: Diagnosis present

## 2014-10-03 DIAGNOSIS — R1312 Dysphagia, oropharyngeal phase: Secondary | ICD-10-CM | POA: Diagnosis not present

## 2014-10-03 DIAGNOSIS — K8020 Calculus of gallbladder without cholecystitis without obstruction: Secondary | ICD-10-CM | POA: Diagnosis not present

## 2014-10-03 DIAGNOSIS — I482 Chronic atrial fibrillation: Secondary | ICD-10-CM | POA: Diagnosis present

## 2014-10-03 DIAGNOSIS — Z7952 Long term (current) use of systemic steroids: Secondary | ICD-10-CM | POA: Diagnosis not present

## 2014-10-03 DIAGNOSIS — Z952 Presence of prosthetic heart valve: Secondary | ICD-10-CM | POA: Diagnosis not present

## 2014-10-03 DIAGNOSIS — G8194 Hemiplegia, unspecified affecting left nondominant side: Secondary | ICD-10-CM | POA: Diagnosis not present

## 2014-10-03 DIAGNOSIS — R110 Nausea: Secondary | ICD-10-CM | POA: Diagnosis not present

## 2014-10-03 DIAGNOSIS — Z7901 Long term (current) use of anticoagulants: Secondary | ICD-10-CM | POA: Diagnosis not present

## 2014-10-03 DIAGNOSIS — I350 Nonrheumatic aortic (valve) stenosis: Secondary | ICD-10-CM | POA: Diagnosis present

## 2014-10-03 DIAGNOSIS — G8918 Other acute postprocedural pain: Secondary | ICD-10-CM | POA: Diagnosis not present

## 2014-10-03 DIAGNOSIS — Z66 Do not resuscitate: Secondary | ICD-10-CM | POA: Diagnosis present

## 2014-10-03 DIAGNOSIS — N3000 Acute cystitis without hematuria: Secondary | ICD-10-CM | POA: Diagnosis not present

## 2014-10-03 DIAGNOSIS — M810 Age-related osteoporosis without current pathological fracture: Secondary | ICD-10-CM | POA: Diagnosis present

## 2014-10-03 DIAGNOSIS — S72031D Displaced midcervical fracture of right femur, subsequent encounter for closed fracture with routine healing: Secondary | ICD-10-CM | POA: Diagnosis not present

## 2014-10-03 DIAGNOSIS — I6350 Cerebral infarction due to unspecified occlusion or stenosis of unspecified cerebral artery: Secondary | ICD-10-CM | POA: Diagnosis not present

## 2014-10-03 DIAGNOSIS — I38 Endocarditis, valve unspecified: Secondary | ICD-10-CM | POA: Diagnosis not present

## 2014-10-03 DIAGNOSIS — R918 Other nonspecific abnormal finding of lung field: Secondary | ICD-10-CM | POA: Diagnosis not present

## 2014-10-03 DIAGNOSIS — Z471 Aftercare following joint replacement surgery: Secondary | ICD-10-CM | POA: Diagnosis not present

## 2014-10-03 DIAGNOSIS — R2689 Other abnormalities of gait and mobility: Secondary | ICD-10-CM | POA: Diagnosis not present

## 2014-10-03 DIAGNOSIS — R262 Difficulty in walking, not elsewhere classified: Secondary | ICD-10-CM | POA: Diagnosis not present

## 2014-10-03 DIAGNOSIS — J939 Pneumothorax, unspecified: Secondary | ICD-10-CM | POA: Diagnosis not present

## 2014-10-03 DIAGNOSIS — J811 Chronic pulmonary edema: Secondary | ICD-10-CM | POA: Diagnosis not present

## 2014-10-03 DIAGNOSIS — Z8249 Family history of ischemic heart disease and other diseases of the circulatory system: Secondary | ICD-10-CM | POA: Diagnosis not present

## 2014-10-03 DIAGNOSIS — Z96641 Presence of right artificial hip joint: Secondary | ICD-10-CM | POA: Diagnosis not present

## 2014-10-03 DIAGNOSIS — I517 Cardiomegaly: Secondary | ICD-10-CM | POA: Diagnosis not present

## 2014-10-03 DIAGNOSIS — J90 Pleural effusion, not elsewhere classified: Secondary | ICD-10-CM | POA: Diagnosis not present

## 2014-10-03 DIAGNOSIS — R768 Other specified abnormal immunological findings in serum: Secondary | ICD-10-CM | POA: Diagnosis not present

## 2014-10-03 DIAGNOSIS — M069 Rheumatoid arthritis, unspecified: Secondary | ICD-10-CM | POA: Diagnosis not present

## 2014-10-03 DIAGNOSIS — I10 Essential (primary) hypertension: Secondary | ICD-10-CM | POA: Diagnosis not present

## 2014-10-03 DIAGNOSIS — Z5189 Encounter for other specified aftercare: Secondary | ICD-10-CM | POA: Diagnosis not present

## 2014-10-03 DIAGNOSIS — M79604 Pain in right leg: Secondary | ICD-10-CM | POA: Diagnosis not present

## 2014-10-03 HISTORY — PX: CARDIAC CATHETERIZATION: SHX172

## 2014-10-03 LAB — BASIC METABOLIC PANEL
ANION GAP: 7 (ref 5–15)
BUN: 13 mg/dL (ref 6–20)
CALCIUM: 7.8 mg/dL — AB (ref 8.9–10.3)
CO2: 24 mmol/L (ref 22–32)
CREATININE: 0.42 mg/dL — AB (ref 0.44–1.00)
Chloride: 109 mmol/L (ref 101–111)
Glucose, Bld: 105 mg/dL — ABNORMAL HIGH (ref 65–99)
Potassium: 3.8 mmol/L (ref 3.5–5.1)
Sodium: 140 mmol/L (ref 135–145)

## 2014-10-03 LAB — SURGICAL PATHOLOGY

## 2014-10-03 LAB — CBC
HCT: 30 % — ABNORMAL LOW (ref 35.0–47.0)
Hemoglobin: 10 g/dL — ABNORMAL LOW (ref 12.0–16.0)
MCH: 27.8 pg (ref 26.0–34.0)
MCHC: 33.2 g/dL (ref 32.0–36.0)
MCV: 83.8 fL (ref 80.0–100.0)
PLATELETS: 204 10*3/uL (ref 150–440)
RBC: 3.58 MIL/uL — AB (ref 3.80–5.20)
RDW: 15.2 % — AB (ref 11.5–14.5)
WBC: 9.8 10*3/uL (ref 3.6–11.0)

## 2014-10-03 LAB — TROPONIN I
TROPONIN I: 1.82 ng/mL — AB (ref <0.031)
Troponin I: 1.72 ng/mL — ABNORMAL HIGH (ref <0.031)

## 2014-10-03 LAB — HEPARIN LEVEL (UNFRACTIONATED)
HEPARIN UNFRACTIONATED: 0.27 [IU]/mL — AB (ref 0.30–0.70)
HEPARIN UNFRACTIONATED: 0.19 [IU]/mL — AB (ref 0.30–0.70)

## 2014-10-03 LAB — MAGNESIUM: Magnesium: 2.3 mg/dL (ref 1.7–2.4)

## 2014-10-03 SURGERY — LEFT HEART CATH
Anesthesia: Moderate Sedation

## 2014-10-03 MED ORDER — MIDAZOLAM HCL 2 MG/2ML IJ SOLN
INTRAMUSCULAR | Status: AC
  Filled 2014-10-03: qty 2

## 2014-10-03 MED ORDER — IOHEXOL 300 MG/ML  SOLN
INTRAMUSCULAR | Status: DC | PRN
  Administered 2014-10-03: 150 mL via INTRA_ARTERIAL

## 2014-10-03 MED ORDER — FENTANYL CITRATE (PF) 100 MCG/2ML IJ SOLN
INTRAMUSCULAR | Status: DC | PRN
  Administered 2014-10-03: 50 ug via INTRAVENOUS

## 2014-10-03 MED ORDER — METOPROLOL TARTRATE 50 MG PO TABS: 50.0000 mg | ORAL_TABLET | Freq: Two times a day (BID) | ORAL | Status: DC

## 2014-10-03 MED ORDER — HEPARIN BOLUS VIA INFUSION
2000.0000 [IU] | Freq: Once | INTRAVENOUS | Status: AC
Start: 2014-10-03 — End: 2014-10-03
  Administered 2014-10-03: 2000 [IU] via INTRAVENOUS
  Filled 2014-10-03: qty 2000

## 2014-10-03 MED ORDER — SODIUM CHLORIDE 0.9 % IJ SOLN: 3.0000 mL | INTRAMUSCULAR | Status: DC | PRN

## 2014-10-03 MED ORDER — SODIUM CHLORIDE 0.9 % IV SOLN: 250.0000 mL | INTRAVENOUS | Status: DC | PRN

## 2014-10-03 MED ORDER — ASPIRIN 81 MG PO CHEW: 81.0000 mg | CHEWABLE_TABLET | ORAL | Status: DC

## 2014-10-03 MED ORDER — SODIUM CHLORIDE 0.9 % IJ SOLN: 3.0000 mL | Freq: Two times a day (BID) | INTRAMUSCULAR | Status: DC

## 2014-10-03 MED ORDER — SODIUM CHLORIDE 0.9 % WEIGHT BASED INFUSION: 1.0000 mL/kg/h | INTRAVENOUS | Status: DC

## 2014-10-03 MED ORDER — FENTANYL CITRATE (PF) 100 MCG/2ML IJ SOLN
INTRAMUSCULAR | Status: AC
  Filled 2014-10-03: qty 2

## 2014-10-03 MED ORDER — SODIUM CHLORIDE 0.9 % WEIGHT BASED INFUSION: 3.0000 mL/kg/h | INTRAVENOUS | Status: DC

## 2014-10-03 MED ORDER — SODIUM CHLORIDE 0.9 % WEIGHT BASED INFUSION
3.0000 mL/kg/h | INTRAVENOUS | Status: AC
  Administered 2014-10-03: 1.255 mL/kg/h via INTRAVENOUS

## 2014-10-03 MED ORDER — PANTOPRAZOLE SODIUM 40 MG PO TBEC: 40.0000 mg | DELAYED_RELEASE_TABLET | Freq: Every day | ORAL | Status: DC

## 2014-10-03 MED ORDER — MIDAZOLAM HCL 2 MG/2ML IJ SOLN
INTRAMUSCULAR | Status: DC | PRN
  Administered 2014-10-03: 1 mg via INTRAVENOUS

## 2014-10-03 MED ORDER — HEPARIN (PORCINE) IN NACL 2-0.9 UNIT/ML-% IJ SOLN
INTRAMUSCULAR | Status: AC
  Filled 2014-10-03: qty 1000

## 2014-10-03 MED ORDER — LIDOCAINE HCL (PF) 1 % IJ SOLN
INTRAMUSCULAR | Status: DC | PRN
  Administered 2014-10-03: 15 mL

## 2014-10-03 SURGICAL SUPPLY — 9 items
CATH INFINITI 5FR ANG PIGTAIL (CATHETERS) ×2 IMPLANT
CATH INFINITI 5FR JL4 (CATHETERS) ×2 IMPLANT
CATH INFINITI JR4 5F (CATHETERS) ×2 IMPLANT
DEVICE CLOSURE MYNXGRIP 5F (Vascular Products) ×2 IMPLANT
KIT MANI 3VAL PERCEP (MISCELLANEOUS) ×2 IMPLANT
NEEDLE PERC 18GX7CM (NEEDLE) ×2 IMPLANT
PACK CARDIAC CATH (CUSTOM PROCEDURE TRAY) ×2 IMPLANT
SHEATH PINNACLE 5F 10CM (SHEATH) ×2 IMPLANT
WIRE EMERALD 3MM-J .035X150CM (WIRE) ×2 IMPLANT

## 2014-10-03 NOTE — Progress Notes (Signed)
ANTICOAGULATION CONSULT NOTE - Follow-Up Consult  Pharmacy Consult for heparin drip Indication: ACS/STEMI  Allergies  Allergen Reactions  . Methotrexate Cough    Other reaction(s): Other (See Comments) Oral ulcers  . Tofacitinib Swelling    Other reaction(s): Other (See Comments) thrush    Patient Measurements: Height: 5' 3.5" (161.3 cm) Weight: 175 lb 11.2 oz (79.697 kg) IBW/kg (Calculated) : 53.55 Heparin Dosing Weight: 68kg  Vital Signs: Temp: 98.5 F (36.9 C) (09/06 1951) Temp Source: Oral (09/06 1951) BP: 153/68 mmHg (09/06 1951) Pulse Rate: 70 (09/06 1952)  Labs:  Recent Labs  09/30/14 0724 10/01/14 0631  10/02/14 0033 10/02/14 0339 10/02/14 0646 10/02/14 1340 10/02/14 2137 10/02/14 2339  HGB 11.2* 9.9*  --  9.4*  --   --   --   --   --   HCT 34.9* 30.7*  --  29.1*  --   --   --   --   --   PLT 236 215  --  211  --   --   --   --   --   APTT  --   --   --   --  34  --   --   --   --   LABPROT 16.9* 15.6*  --   --  33.0*  --   --   --   --   INR 1.35 1.22  --   --  3.23  --   --   --   --   HEPARINUNFRC  --   --   --   --   --   --  0.22*  --  0.19*  CREATININE 0.53 0.59  --  0.50  --   --   --   --   --   TROPONINI  --   --   < > 1.51*  --  3.05*  --  3.01*  --   < > = values in this interval not displayed.  Estimated Creatinine Clearance: 56.7 mL/min (by C-G formula based on Cr of 0.5).   Medical History: Past Medical History  Diagnosis Date  . Hypertension   . Arthritis   . Osteoporosis   . Hyperlipidemia   . Atrial fibrillation   . Anemia   . Left rotator cuff tear     Medications:    Assessment: hgb 9.4  plt 211 Heparin level (anti-Xa) 0.22 units/mL Current heparin rate 800 units/hr; no bleeding noted.   Goal of Therapy:  Heparin level 0.3-0.7 units/ml Monitor platelets by anticoagulation protocol: Yes   Plan:  Pt had been on Lovenox 40 mg daily, last dose 9/5 PM. As heparin level is subtherapeutic will give bolus of 1000 units  (15 units/kg) and increase rate to 950 units/hr per heparin dosing protocol. Next anti-Xa scheduled in 8 hours at 2230. CBC in the AM.  9/6 22:30 anti-Xa 0.19. 2000 unit bolus and increase to 1200 units/hr. Recheck in 8 hours.  Tahani Potier S, PharmD 10/03/2014,1:33 AM

## 2014-10-03 NOTE — Progress Notes (Signed)
  Subjective: 3 Days Post-Op Procedure(s) (LRB): ARTHROPLASTY BIPOLAR HIP (HEMIARTHROPLASTY) (Right) Patient reports pain as mild.   Patient is well, but has had some minor complaints of UTI symptoms Plan is to go Skilled nursing facility after hospital stay. Negative for chest pain and shortness of breath Fever: no Gastrointestinal:Negative for nausea and vomiting  Objective: Vital signs in last 24 hours: Temp:  [98.2 F (36.8 C)-98.9 F (37.2 C)] 98.8 F (37.1 C) (09/07 0625) Pulse Rate:  [49-71] 49 (09/07 0625) Resp:  [18-21] 21 (09/07 0625) BP: (139-164)/(67-80) 164/67 mmHg (09/07 0625) SpO2:  [93 %-97 %] 94 % (09/07 0625)  Intake/Output from previous day:  Intake/Output Summary (Last 24 hours) at 10/03/14 0803 Last data filed at 10/03/14 0653  Gross per 24 hour  Intake 2036.73 ml  Output    150 ml  Net 1886.73 ml    Intake/Output this shift:    Labs:  Recent Labs  10/01/14 0631 10/02/14 0033 10/03/14 0456  HGB 9.9* 9.4* 10.0*    Recent Labs  10/02/14 0033 10/03/14 0456  WBC 11.8* 9.8  RBC 3.49* 3.58*  HCT 29.1* 30.0*  PLT 211 204    Recent Labs  10/02/14 0033 10/03/14 0456  NA 140 140  K 4.0 3.8  CL 111 109  CO2 24 24  BUN 19 13  CREATININE 0.50 0.42*  GLUCOSE 105* 105*  CALCIUM 7.7* 7.8*    Recent Labs  10/01/14 0631 10/02/14 0339  INR 1.22 3.23     EXAM General - Patient is Alert, Appropriate and Oriented Extremity - Neurologically intact ABD soft Intact pulses distally Dorsiflexion/Plantar flexion intact Incision: scant drainage No cellulitis present Dressing/Incision - blood tinged drainage Motor Function - intact, moving foot and toes well on exam.   Abdomen is soft on exam without tympany.  Past Medical History  Diagnosis Date  . Hypertension   . Arthritis   . Osteoporosis   . Hyperlipidemia   . Atrial fibrillation   . Anemia   . Left rotator cuff tear     Assessment/Plan: 3 Days Post-Op Procedure(s)  (LRB): ARTHROPLASTY BIPOLAR HIP (HEMIARTHROPLASTY) (Right) Active Problems:   Hip fracture  Estimated body mass index is 30.63 kg/(m^2) as calculated from the following:   Height as of this encounter: 5' 3.5" (1.613 m).   Weight as of this encounter: 79.697 kg (175 lb 11.2 oz). Advance diet Up with therapy Continue foley due to acute urinary retention   Honeycomb dressing intact.  Dressing to be changed later today. Still complaining of UTI symptoms, continue Keflex and Pyridium. Pt has had a BM. Chest pain better today.  Being followed by cardiology, they are debating potential cath lab. PT cancelled yesterday due to UTI pain, will resume today.  DVT Prophylaxis - Foot Pumps, TED hose and Heparin Weight-Bearing as tolerated to right leg  J. Horris Latino, PA-C [facility] Orthopaedic Surgery 10/03/2014, 8:03 AM

## 2014-10-03 NOTE — Progress Notes (Signed)
ANTICOAGULATION CONSULT NOTE - Follow-Up Consult  Pharmacy Consult for heparin drip Indication: ACS/STEMI  Allergies  Allergen Reactions  . Methotrexate Cough    Other reaction(s): Other (See Comments) Oral ulcers  . Tofacitinib Swelling    Other reaction(s): Other (See Comments) thrush    Patient Measurements: Height: 5' 3.5" (161.3 cm) Weight: 175 lb 11.2 oz (79.697 kg) IBW/kg (Calculated) : 53.55 Heparin Dosing Weight: 68kg  Vital Signs: Temp: 98.7 F (37.1 C) (09/07 1428) Temp Source: Oral (09/07 1428) BP: 150/68 mmHg (09/07 1428) Pulse Rate: 45 (09/07 1428)  Labs:  Recent Labs  10/01/14 0631  10/02/14 0033 10/02/14 0339  10/02/14 1340 10/02/14 2137 10/02/14 2339 10/03/14 0456 10/03/14 1039  HGB 9.9*  --  9.4*  --   --   --   --   --  10.0*  --   HCT 30.7*  --  29.1*  --   --   --   --   --  30.0*  --   PLT 215  --  211  --   --   --   --   --  204  --   APTT  --   --   --  34  --   --   --   --   --   --   LABPROT 15.6*  --   --  33.0*  --   --   --   --   --   --   INR 1.22  --   --  3.23  --   --   --   --   --   --   HEPARINUNFRC  --   --   --   --   --  0.22*  --  0.19*  --  0.27*  CREATININE 0.59  --  0.50  --   --   --   --   --  0.42*  --   TROPONINI  --   < > 1.51*  --   < >  --  3.01*  --  1.82* 1.72*  < > = values in this interval not displayed.  Estimated Creatinine Clearance: 56.7 mL/min (by C-G formula based on Cr of 0.42).   Medical History: Past Medical History  Diagnosis Date  . Hypertension   . Arthritis   . Osteoporosis   . Hyperlipidemia   . Atrial fibrillation   . Anemia   . Left rotator cuff tear     Medications:    Assessment: hgb 10  plt 204 Heparin level (anti-Xa) 0.27 units/mL Current heparin rate 1200 units/hr; no bleeding noted.   Goal of Therapy:  Heparin level 0.3-0.7 units/ml Monitor platelets by anticoagulation protocol: Yes   Plan:  Anti-Xa slightly below goal range of 0.3-0.7.  Patient went to cath  lab at ~1300 today therefore no bolus or drip rate change initiated as drip was to be discontinued prior to procedure.  MD wishes to continue heparin drip.  Therefore, will continue current heparin drip at rate of 1200 units/hr and recheck HL in 8 hours to assess for rate change at 2330.  CBC ordred in AM.  Pharmacy will continue to follow.  Sherry Ruffing, PharmD 10/03/2014,3:09 PM

## 2014-10-03 NOTE — Progress Notes (Signed)
[facility] - [place] at [facility]   PATIENT NAME: Annely Sliva    MR#:  [id number]  DATE OF BIRTH:  [birth year]  SUBJECTIVE:   C/o burning in lower abodmen, denies cp or sob REVIEW OF SYSTEMS:   Review of Systems  Constitutional: Negative for fever, chills and weight loss.  HENT: Negative for ear discharge, ear pain and nosebleeds.   Eyes: Negative for blurred vision, pain and discharge.  Respiratory: Negative for sputum production, shortness of breath, wheezing and stridor.   Cardiovascular: Negative for chest pain, palpitations, orthopnea and PND.  Gastrointestinal: Negative for nausea, vomiting, abdominal pain and diarrhea.  Genitourinary: Positive for dysuria. Negative for urgency and frequency.  Musculoskeletal: Negative for back pain and joint pain.  Neurological: Positive for weakness. Negative for sensory change, speech change and focal weakness.  Psychiatric/Behavioral: Negative for depression. The patient is not nervous/anxious.   All other systems reviewed and are negative.  Tolerating Diet:yes Tolerating PT: yes (rec SNF)  DRUG ALLERGIES:   Allergies  Allergen Reactions  . Methotrexate Cough    Other reaction(s): Other (See Comments) Oral ulcers  . Tofacitinib Swelling    Other reaction(s): Other (See Comments) thrush    VITALS:  Blood pressure 155/84, pulse 50, temperature 99.1 F (37.3 C), temperature source Oral, resp. rate 16, height 5' 3.5" (1.613 m), weight 79.697 kg (175 lb 11.2 oz), SpO2 93 %.  PHYSICAL EXAMINATION:   Physical Exam  GENERAL:  81 y.o.-year-old patient lying in the bed with no acute distress.  EYES: Pupils equal, round, reactive to light and accommodation. No scleral icterus. Extraocular muscles intact.  HEENT: Head atraumatic, normocephalic. Oropharynx and nasopharynx clear.  NECK:  Supple, no jugular venous distention. No thyroid enlargement, no tenderness.  LUNGS: Normal breath sounds bilaterally, no  wheezing, rales, rhonchi. No use of accessory muscles of respiration.  CARDIOVASCULAR: S1, S2 normal. No murmurs, rubs, or gallops. Irregular rhythm ABDOMEN: Soft, nontender, nondistended. Bowel sounds present. No organomegaly or mass.  EXTREMITIES: No cyanosis, clubbing or edema b/l.   Surgical dressing+ right hip NEUROLOGIC: Cranial nerves II through XII are intact. No focal Motor or sensory deficits b/l.   PSYCHIATRIC: patient is alert and oriented x 3.  SKIN: No obvious rash, lesion, or ulcer.    LABORATORY PANEL:   CBC  Recent Labs Lab 10/03/14 0456  WBC 9.8  HGB 10.0*  HCT 30.0*  PLT 204    Chemistries   Recent Labs Lab 10/03/14 0456  NA 140  K 3.8  CL 109  CO2 24  GLUCOSE 105*  BUN 13  CREATININE 0.42*  CALCIUM 7.8*  MG 2.3    Cardiac Enzymes  Recent Labs Lab 10/03/14 1039  TROPONINI 1.72*    RADIOLOGY:  No results found.   ASSESSMENT AND PLAN:  Landy Mace is a 81 y.o. female with a known history of rheumatoid arthritis, atrial fibrillation on Coumadin and essential hypertension who presents with above complaint. Patient was at the refrigerator this morning using her walker. She turned around and unfortunately suffered a mechanical fall on the right side. She came to the ER for further evaluation as she was having right hip pain  * POD#2 with right femoral neck fracture/ s/p surgery on sept 3rd PT Recommends SNF  *Acute NQWMI -came in with troponin of 0.58--->3.01 -now on IV heparin gtt -d/w dr Gwen Pounds  To take pt for cath today -on BB,ace,statins  *Acute Cystitis -po keflex and pyridium  *chronic afib -on BB, HR  stable -off coumadin due to pt now being on heparin gtt  *h/o Rheumatoid arthritis -on po steroids. Received stress doses of steroids  *h/o HL on gemfibrozil  Case discussed with Care Management/Social Worker. For d/c planning to rehab Management plans discussed with the patient and  in agreement.  CODE STATUS:  full  DVT Prophylaxis: heparin  TOTAL TIME TAKING CARE OF THIS PATIENT: 30inutes.  >50% time spent on counselling and coordination of care  POSSIBLE D/C IN *2-3S, DEPENDING ON CLINICAL CONDITION.   Elleanna Melling M.D on 10/03/2014 at 1:18 PM  Between 7am to 6pm - Pager - [phone]  After 6pm go to [email or web address] - password [email or web address] [facility]  [place] [facility]  Office  [phone]  CC: Primary care physician; Elizabeth Sauer, MD

## 2014-10-03 NOTE — Progress Notes (Signed)
PT Cancellation Note  Patient Details Name: Sonia Thompson MRN: [id number] DOB: 05-24-1932   Cancelled Treatment:    Reason Eval/Treat Not Completed: Other (comment) (Pt refused but is going to cardiac cath lab).  Will check back as time allows   Ivar Drape 10/03/2014, 11:26 AM   Samul Dada, PT MS Acute Rehab Dept. Number: [facility] [id number] and [facility] [phone]

## 2014-10-03 NOTE — Progress Notes (Signed)
Report to cheryl telemetry.  Check right groin for bleeding or hematoma.  Patient will be on bedrest for 2 hours post sheath pull---out of bed at 15:30.  Bilateral pulses are 2's DP's.Sonia Thompson

## 2014-10-03 NOTE — Discharge Summary (Signed)
[facility] - [place] at [facility]   PATIENT NAME: Sonia Thompson    MR#:  [id number]  DATE OF BIRTH:  July 14, 1932  DATE OF ADMISSION:  09/29/2014 ADMITTING PHYSICIAN: Christena Flake, MD  DATE OF DISCHARGE: No discharge date for patient encounter.  PRIMARY CARE PHYSICIAN: Elizabeth Sauer, MD    ADMISSION DIAGNOSIS:  Bilateral leg pain [M79.604, M79.605] Closed right hip fracture, initial encounter [S72.001A]  DISCHARGE DIAGNOSIS:  Active Problems:   Hip fracture  abnormal cardiac catheterization  SECONDARY DIAGNOSIS:   Past Medical History  Diagnosis Date  . Hypertension   . Arthritis   . Osteoporosis   . Hyperlipidemia   . Atrial fibrillation   . Anemia   . Left rotator cuff tear     HOSPITAL COURSE:   Sonia Thompson is a 81 y.o. female with a known history of rheumatoid arthritis, atrial fibrillation on Coumadin and essential hypertension who presents with above complaint. Patient was at the refrigerator this morning using her walker. She turned around and unfortunately suffered a mechanical fall on the right side. She came to the ER for further evaluation as she was having right hip pain.    *Acute NQWMI  -came in with troponin of 0.58--->3.01 -now on IV heparin gtt -Patient had cardiac catheterization which is abnormal, the Dr. Gwen Pounds has recommended to transfer the patient to [facility] for possible coronary artery bypass grafting. Patient is willing to be transferred. Accepting physician Dr. Kennon Rounds -on BB,ace,statins  * POD#2 with right femoral neck fracture/ s/p surgery on sept 3rd PT Recommends SNF  *Acute Cystitis -po keflex and pyridium  *chronic afib -on BB, HR stable -off coumadin due to pt now being on heparin gtt  *h/o Rheumatoid arthritis -on po steroids. Received stress doses of steroids  *h/o HL on gemfibrozil  DISCHARGE CONDITIONS:   Transferred to [facility] when bed is available  CONSULTS OBTAINED:   Treatment Team:  Ramonita Lab, MD Lamar Blinks, MD   PROCEDURES cardiac catheter today 10/03/2014  DRUG ALLERGIES:   Allergies  Allergen Reactions  . Methotrexate Cough    Other reaction(s): Other (See Comments) Oral ulcers  . Tofacitinib Swelling    Other reaction(s): Other (See Comments) thrush    DISCHARGE MEDICATIONS:   Current Discharge Medication List    CONTINUE these medications which have NOT CHANGED   Details  Adalimumab 40 MG/0.8ML PNKT Inject 40 mg into the skin every 14 (fourteen) days.    Associated Diagnoses: IDA (iron deficiency anemia)    alendronate (FOSAMAX) 70 MG tablet 1 tablet once a week. Take on Saturdays.    benazepril (LOTENSIN) 40 MG tablet TAKE 1 TABLET BY MOUTH ONCE A DAY   Associated Diagnoses: IDA (iron deficiency anemia)    esomeprazole (NEXIUM) 20 MG capsule Take 20 mg by mouth daily at 12 noon.    gemfibrozil (LOPID) 600 MG tablet TAKE 1 TABLET BY MOUTH ONCE DAILY Qty: 30 tablet, Refills: 2   Associated Diagnoses: Hyperlipidemia    metoprolol (LOPRESSOR) 50 MG tablet TAKE 1 TABLET BY MOUTH TWICE A DAY   Associated Diagnoses: IDA (iron deficiency anemia)    Omega-3 Fatty Acids (FISH OIL) 1000 MG CAPS Take 1,000 mg by mouth daily.    predniSONE (DELTASONE) 5 MG tablet TAKE 1 TABLET (5 MG TOTAL) BY MOUTH ONCE DAILY.   Associated Diagnoses: IDA (iron deficiency anemia)    !! warfarin (COUMADIN) 1 MG tablet Take 1 mg by mouth every Monday, Wednesday, and Friday.  Take along with 6mg  tablet to equal 7mg  total.   Associated Diagnoses: IDA (iron deficiency anemia)    !! warfarin (COUMADIN) 6 MG tablet Take 6 mg by mouth daily.     !! - Potential duplicate medications found. Please discuss with provider.       DISCHARGE INSTRUCTIONS:   Patient is getting transferred to [facility] for abnormal cardiac catheter for possible coronary artery bypass grafting  DIET:  Healthy heart  DISCHARGE CONDITION:  Hemodynamically  stable  ACTIVITY:  Bedrest  OXYGEN:  Home Oxygen: Yes.     Oxygen Delivery: 2% O2 via [place]  DISCHARGE LOCATION:  Getting transferred to [facility] to [facility] service for abnormal cardiac catheterization and possible, reactive bypass grafting  If you experience worsening of your admission symptoms, develop shortness of breath, life threatening emergency, suicidal or homicidal thoughts you must seek medical attention immediately by calling 911 or calling your MD immediately  if symptoms less severe.  You Must read complete instructions/literature along with all the possible adverse reactions/side effects for all the Medicines you take and that have been prescribed to you. Take any new Medicines after you have completely understood and accpet all the possible adverse reactions/side effects.   Please note  You were cared for by a hospitalist during your hospital stay. If you have any questions about your discharge medications or the care you received while you were in the hospital after you are discharged, you can call the unit and asked to speak with the hospitalist on call if the hospitalist that took care of you is not available. Once you are discharged, your primary care physician will handle any further medical issues. Please note that NO REFILLS for any discharge medications will be authorized once you are discharged, as it is imperative that you return to your primary care physician (or establish a relationship with a primary care physician if you do not have one) for your aftercare needs so that they can reassess your need for medications and monitor your lab values.     Today  Chief Complaint  Patient presents with  . Fall  . Hip Injury   Patient denies any chest pain or shortness of breath and willing to be transferred to [facility]  ROS:  CONSTITUTIONAL: Denies fevers, chills. Denies any fatigue, weakness.  EYES: Denies blurry vision, double vision, eye pain. EARS,  NOSE, THROAT: Denies tinnitus, ear pain, hearing loss. RESPIRATORY: Denies cough, wheeze, shortness of breath.  CARDIOVASCULAR: Denies chest pain, palpitations, edema.  GASTROINTESTINAL: Denies nausea, vomiting, diarrhea, abdominal pain. Denies bright red blood per rectum. GENITOURINARY: Denies dysuria, hematuria. ENDOCRINE: Denies nocturia or thyroid problems. HEMATOLOGIC AND LYMPHATIC: Denies easy bruising or bleeding. SKIN: Denies rash or lesion. MUSCULOSKELETAL: Denies pain in neck, back, shoulder, knees, hips or arthritic symptoms. Hip pain NEUROLOGIC: Denies paralysis, paresthesias.  PSYCHIATRIC: Denies anxiety or depressive symptoms.   VITAL SIGNS:  Blood pressure 143/92, pulse 93, temperature 98.5 F (36.9 C), temperature source Oral, resp. rate 20, height 5' 3.5" (1.613 m), weight 79.697 kg (175 lb 11.2 oz), SpO2 97 %.  I/O:   Intake/Output Summary (Last 24 hours) at 10/03/14 1920 Last data filed at 10/03/14 1744  Gross per 24 hour  Intake 2675.06 ml  Output   1550 ml  Net 1125.06 ml    PHYSICAL EXAMINATION:  GENERAL:  81 y.o.-year-old patient lying in the bed with no acute distress.  EYES: Pupils equal, round, reactive to light and accommodation. No scleral icterus. Extraocular  muscles intact.  HEENT: Head atraumatic, normocephalic. Oropharynx and nasopharynx clear.  NECK:  Supple, no jugular venous distention. No thyroid enlargement, no tenderness.  LUNGS: Normal breath sounds bilaterally, no wheezing, rales,rhonchi or crepitation. No use of accessory muscles of respiration.  CARDIOVASCULAR: S1, S2 normal. No murmurs, rubs, or gallops.  ABDOMEN: Soft, non-tender, non-distended. Bowel sounds present. No organomegaly or mass.  EXTREMITIES: No pedal edema, cyanosis, or clubbing.  NEUROLOGIC: Cranial nerves II through XII are intact. Muscle strength 5/5 in all extremities. Sensation intact. Gait not checked.  PSYCHIATRIC: The patient is alert and oriented x 3.  SKIN: No  obvious rash, lesion, or ulcer.   DATA REVIEW:   CBC  Recent Labs Lab 10/03/14 0456  WBC 9.8  HGB 10.0*  HCT 30.0*  PLT 204    Chemistries   Recent Labs Lab 10/03/14 0456  NA 140  K 3.8  CL 109  CO2 24  GLUCOSE 105*  BUN 13  CREATININE 0.42*  CALCIUM 7.8*  MG 2.3    Cardiac Enzymes  Recent Labs Lab 10/03/14 1039  TROPONINI 1.72*    Microbiology Results  Results for orders placed or performed during the hospital encounter of 09/29/14  Urine culture     Status: None   Collection Time: 09/29/14  2:55 PM  Result Value Ref Range Status   Specimen Description URINE, RANDOM  Final   Special Requests Normal  Final   Culture >=100,000 COLONIES/mL KLEBSIELLA PNEUMONIAE  Final   Report Status 10/02/2014 FINAL  Final   Organism ID, Bacteria KLEBSIELLA PNEUMONIAE  Final      Susceptibility   Klebsiella pneumoniae - MIC*    AMPICILLIN 16 RESISTANT Resistant     CEFTAZIDIME <=1 SENSITIVE Sensitive     CEFAZOLIN <=4 SENSITIVE Sensitive     CEFTRIAXONE <=1 SENSITIVE Sensitive     CIPROFLOXACIN <=0.25 SENSITIVE Sensitive     GENTAMICIN <=1 SENSITIVE Sensitive     IMIPENEM <=0.25 SENSITIVE Sensitive     TRIMETH/SULFA <=20 SENSITIVE Sensitive     NITROFURANTOIN Value in next row Sensitive      SENSITIVE<=16    PIP/TAZO Value in next row Sensitive      SENSITIVE<=4    * >=100,000 COLONIES/mL KLEBSIELLA PNEUMONIAE    RADIOLOGY:  Dg Hip Port Unilat With Pelvis 1v Right  09/30/2014   CLINICAL DATA:  Right hip hemiarthroplasty.  EXAM: DG HIP (WITH OR WITHOUT PELVIS) 1V PORT RIGHT  COMPARISON:  09/29/2014 radiograph  FINDINGS: Right hip hemiarthroplasty identified without complicating features.  There is no evidence of acute fracture or subluxation.  IMPRESSION: Right hip hemiarthroplasty without complicating features.   Electronically Signed   By: Harmon Pier M.D.   On: 09/30/2014 13:36    EKG:   Orders placed or performed during the hospital encounter of 09/29/14  .  ED EKG  . ED EKG  . ED EKG  . ED EKG  . EKG 12-Lead  . EKG 12-Lead  . EKG      Management plans discussed with the patient, family and they are in agreement.  CODE STATUS:     Code Status Orders        Start     Ordered   10/03/14 1330  Full code   Continuous     10/03/14 1330      TOTAL TIME TAKING CARE OF THIS PATIENT: 45 minutes.    @[facility] @  on 10/03/2014 at 7:20 PM  Between 7am to 6pm - Pager - [phone]  After 6pm go to [email or web address] - password [email or web address] [facility]  [place] [facility]  Office  [phone]  CC: Primary care physician; Elizabeth Sauer, MD

## 2014-10-03 NOTE — Progress Notes (Signed)
Spoke with Leota Jacobsen at [facility], she is looking for a bed for this patient.  Dr Kennon Rounds will evaluate the patient for possible CABG at this time, or he may elect to wait until pt has had physical therapy for her hip

## 2014-10-03 NOTE — Progress Notes (Signed)
Notified physician of urinary retention. Pt c/o painful urination. Bladder scan revealed 567cc in bladder.

## 2014-10-03 NOTE — Progress Notes (Signed)
[facility] Cardiology [facility] Encounter Note  Patient: Sonia Thompson / Admit Date: 09/29/2014 / Date of Encounter: 10/03/2014, 8:20 AM   Subjective: Urinary tract burning and pain but no evidence of chest discomfort with ambulation  Review of Systems: Positive for: Urinary frequency Negative for: Vision change, hearing change, syncope, dizziness, nausea, vomiting,diarrhea, bloody stool, stomach pain, cough, congestion, diaphoresis,  ,skin lesions, skin rashes Others previously listed  Objective: Telemetry: Normal sinus rhythm Physical Exam: Blood pressure 164/67, pulse 49, temperature 98.8 F (37.1 C), temperature source Oral, resp. rate 21, height 5' 3.5" (1.613 m), weight 175 lb 11.2 oz (79.697 kg), SpO2 94 %. Body mass index is 30.63 kg/(m^2). General: Well developed, well nourished, in no acute distress. Head: Normocephalic, atraumatic, sclera non-icteric, no xanthomas, nares are without discharge. Neck: No apparent masses Lungs: Normal respirations with no wheezes, no rhonchi, few rales , no crackles   Heart: Regular rate and rhythm, normal S1 S2, no murmur, no rub, no gallop, PMI is normal size and placement, carotid upstroke normal without bruit, jugular venous pressure normal Abdomen: Soft, non-tender, non-distended with normoactive bowel sounds. No hepatosplenomegaly. Abdominal aorta is normal size without bruit Extremities: Trace edema, no clubbing, no cyanosis, no ulcers,  Peripheral: 2+ radial, 2+ femoral, 2+ dorsal pedal pulses Neuro: Alert and oriented. Moves all extremities spontaneously. Psych:  Responds to questions appropriately with a normal affect.   Intake/Output Summary (Last 24 hours) at 10/03/14 0820 Last data filed at 10/03/14 0653  Gross per 24 hour  Intake 2036.73 ml  Output    150 ml  Net 1886.73 ml    Inpatient Medications:  . aspirin EC  81 mg Oral Daily  . benazepril  40 mg Oral Daily  . cephALEXin  500 mg Oral Q12H  . docusate sodium   100 mg Oral BID  . gemfibrozil  600 mg Oral Daily  . metoprolol tartrate  50 mg Oral 3 times per day  . pantoprazole  40 mg Oral BID  . phenazopyridine  100 mg Oral TID WC  . predniSONE  5 mg Oral Q breakfast   Infusions:  . dextrose 5 % and 0.9 % NaCl with KCl 20 mEq/L 75 mL/hr at 10/03/14 0636  . heparin 1,200 Units/hr (10/03/14 0151)    Labs:  Recent Labs  10/02/14 0033 10/03/14 0456  NA 140 140  K 4.0 3.8  CL 111 109  CO2 24 24  GLUCOSE 105* 105*  BUN 19 13  CREATININE 0.50 0.42*  CALCIUM 7.7* 7.8*  MG 2.4 2.3   No results for input(s): AST, ALT, ALKPHOS, BILITOT, PROT, ALBUMIN in the last 72 hours.  Recent Labs  10/01/14 0631 10/02/14 0033 10/03/14 0456  WBC 11.6* 11.8* 9.8  NEUTROABS 9.0*  --   --   HGB 9.9* 9.4* 10.0*  HCT 30.7* 29.1* 30.0*  MCV 82.3 83.5 83.8  PLT 215 211 204    Recent Labs  10/02/14 0033 10/02/14 0646 10/02/14 2137 10/03/14 0456  TROPONINI 1.51* 3.05* 3.01* 1.82*   Invalid input(s): POCBNP No results for input(s): HGBA1C in the last 72 hours.   Weights: Filed Weights   09/29/14 1333 10/02/14 0341  Weight: 160 lb (72.576 kg) 175 lb 11.2 oz (79.697 kg)     Radiology/Studies:  Dg Chest 1 View  09/29/2014   CLINICAL DATA:  Two falls within the last week. Bilateral hip pain. History of hypertension, atrial fibrillation.  EXAM: CHEST  1 VIEW  COMPARISON:  12/15/2013  FINDINGS: Heart is  mildly enlarged. The lungs are free of focal consolidations and pleural effusions. Mild perihilar peribronchial thickening. No pulmonary edema. No pneumothorax or acute displaced fractures. Deformity of the right proximal humerus is consistent with old fracture.  IMPRESSION: 1. Cardiomegaly without pulmonary edema. 2. Bronchitic changes.  No focal acute pulmonary abnormality.   Electronically Signed   By: Norva Pavlov M.D.   On: 09/29/2014 15:03   Dg Pelvis 1-2 Views  09/29/2014   CLINICAL DATA:  Fall today with acute right hip pain. Initial  encounter.  EXAM: PELVIS - 1-2 VIEW  COMPARISON:  None.  FINDINGS: A right femoral neck fracture is identified without subluxation or dislocation.  Diffuse osteopenia is noted.  Degenerative changes in the lower lumbar spine are present.  No suspicious focal bony lesions are noted.  IMPRESSION: Right femoral neck fracture.   Electronically Signed   By: Harmon Pier M.D.   On: 09/29/2014 14:49   Dg Hip Port Unilat With Pelvis 1v Right  09/30/2014   CLINICAL DATA:  Right hip hemiarthroplasty.  EXAM: DG HIP (WITH OR WITHOUT PELVIS) 1V PORT RIGHT  COMPARISON:  09/29/2014 radiograph  FINDINGS: Right hip hemiarthroplasty identified without complicating features.  There is no evidence of acute fracture or subluxation.  IMPRESSION: Right hip hemiarthroplasty without complicating features.   Electronically Signed   By: Harmon Pier M.D.   On: 09/30/2014 13:36   Dg Femur Min 2 Views Left  09/29/2014   CLINICAL DATA:  Acute fall today with left leg pain. Initial encounter.  EXAM: LEFT FEMUR 2 VIEWS  COMPARISON:  None.  FINDINGS: There is no evidence of acute fracture, subluxation or dislocation.  Mild degenerative changes within the hip and knee noted.  No focal bony lesions are identified.  IMPRESSION: No evidence of acute bony abnormality.   Electronically Signed   By: Harmon Pier M.D.   On: 09/29/2014 14:48   Dg Femur, Min 2 Views Right  09/29/2014   CLINICAL DATA:  Patient from home. Larey Seat this morning after tripping over her walker. Shortening of the right leg.  EXAM: RIGHT FEMUR 2 VIEWS  COMPARISON:  None.  FINDINGS: There is an acute impacted fracture of the right subcapital femoral neck, associated with varus angulation and foreshortening of femur. No dislocation. Distal femur is intact.  IMPRESSION: Subcapital femoral neck fracture.   Electronically Signed   By: Norva Pavlov M.D.   On: 09/29/2014 14:47     Assessment and Recommendation  81 y.o. female with known nonvalvular paroxysmal atrial fibrillation  now in normal sinus rhythm with essential hypertension and acute non-ST elevation myocardial infarction with elevated troponin and chest discomfort 1. Continue medication management including beta blocker for heart rate control and maintenance of normal sinus rhythm 2. Continue heparin for further risk reduction of deep venous thrombosis and stroke with atrial fibrillation 3. ACE inhibitor for hypertension control without change 4. Possible cardiac catheterization to assess coronary anatomy and further treatment thereof is necessary for acute non-ST elevation myocardial infarction  Signed, Arnoldo Hooker M.D. FACC

## 2014-10-03 NOTE — Progress Notes (Signed)
ANTICOAGULATION CONSULT NOTE - Follow-Up Consult  Pharmacy Consult for heparin drip Indication: ACS/STEMI  Allergies  Allergen Reactions  . Methotrexate Cough    Other reaction(s): Other (See Comments) Oral ulcers  . Tofacitinib Swelling    Other reaction(s): Other (See Comments) thrush    Patient Measurements: Height: 5' 3.5" (161.3 cm) Weight: 175 lb 11.2 oz (79.697 kg) IBW/kg (Calculated) : 53.55 Heparin Dosing Weight: 68kg  Vital Signs: Temp: 99.5 F (37.5 C) (09/07 1129) Temp Source: Oral (09/07 1129) BP: 156/65 mmHg (09/07 1129) Pulse Rate: 51 (09/07 1129)  Labs:  Recent Labs  10/01/14 0631  10/02/14 0033 10/02/14 0339 10/02/14 0646 10/02/14 1340 10/02/14 2137 10/02/14 2339 10/03/14 0456 10/03/14 1039  HGB 9.9*  --  9.4*  --   --   --   --   --  10.0*  --   HCT 30.7*  --  29.1*  --   --   --   --   --  30.0*  --   PLT 215  --  211  --   --   --   --   --  204  --   APTT  --   --   --  34  --   --   --   --   --   --   LABPROT 15.6*  --   --  33.0*  --   --   --   --   --   --   INR 1.22  --   --  3.23  --   --   --   --   --   --   HEPARINUNFRC  --   --   --   --   --  0.22*  --  0.19*  --  0.27*  CREATININE 0.59  --  0.50  --   --   --   --   --  0.42*  --   TROPONINI  --   < > 1.51*  --  3.05*  --  3.01*  --  1.82*  --   < > = values in this interval not displayed.  Estimated Creatinine Clearance: 56.7 mL/min (by C-G formula based on Cr of 0.42).   Medical History: Past Medical History  Diagnosis Date  . Hypertension   . Arthritis   . Osteoporosis   . Hyperlipidemia   . Atrial fibrillation   . Anemia   . Left rotator cuff tear     Medications:    Assessment: hgb 10  plt 204 Heparin level (anti-Xa) 0.27 units/mL Current heparin rate 1200 units/hr; no bleeding noted.   Goal of Therapy:  Heparin level 0.3-0.7 units/ml Monitor platelets by anticoagulation protocol: Yes   Plan:  Anti-Xa slightly below goal range of 0.3-0.7.  Per  nursing, will be turning off heparin drip for cardiac cath around 1230 today.  Will hold off on bolus and drip rate increase as going to cardiac cath scheduled for 1300 today.  Will follow up if heparin drip is continued after cath lab.  Sherry Ruffing, PharmD 10/03/2014,11:55 AM

## 2014-10-03 NOTE — Care Management Important Message (Signed)
Important Message  Patient Details  Name: Monnie Gudgel MRN: [id number] Date of Birth: 27-Dec-1932   Medicare Important Message Given:  Yes-third notification given    Olegario Messier A Allmond 10/03/2014, 2:13 PM

## 2014-10-03 NOTE — Progress Notes (Signed)
[facility] Cardiology [facility] Encounter Note  Patient: Sonia Thompson / Admit Date: 09/29/2014 / Date of Encounter: 10/03/2014, 1:30 PM   Subjective: Continued mild amount of chest pressure with movements and some right hip pain  Review of Systems: Positive for: Hip pain and chest pain Negative for: Vision change, hearing change, syncope, dizziness, nausea, vomiting,diarrhea, bloody stool, stomach pain, cough, congestion, diaphoresis, urinary frequency, urinary pain,skin lesions, skin rashes Others previously listed  Objective: Telemetry: Normal sinus rhythm Physical Exam: Blood pressure 155/84, pulse 50, temperature 99.1 F (37.3 C), temperature source Oral, resp. rate 16, height 5' 3.5" (1.613 m), weight 175 lb 11.2 oz (79.697 kg), SpO2 93 %. Body mass index is 30.63 kg/(m^2). General: Well developed, well nourished, in no acute distress. Head: Normocephalic, atraumatic, sclera non-icteric, no xanthomas, nares are without discharge. Neck: No apparent masses Lungs: Normal respirations with few wheezes, no rhonchi, no rales , few crackles   Heart: Regular rate and rhythm, normal S1 S2, no murmur, no rub, no gallop, PMI is normal size and placement, carotid upstroke normal without bruit, jugular venous pressure normal Abdomen: Soft, non-tender, non-distended with normoactive bowel sounds. No hepatosplenomegaly. Abdominal aorta is normal size   Extremities: No edema, no clubbing, no cyanosis, no ulcers,  Peripheral: 2+ radial, 2+ femoral, 2+ dorsal pedal pulses Neuro: Alert and oriented. Moves all extremities spontaneously. Psych:  Responds to questions appropriately with a normal affect.   Intake/Output Summary (Last 24 hours) at 10/03/14 1330 Last data filed at 10/03/14 1000  Gross per 24 hour  Intake 2156.73 ml  Output    750 ml  Net 1406.73 ml    Inpatient Medications:  . [START ON 10/04/2014] aspirin  81 mg Oral Pre-Cath  . [MAR Hold] aspirin EC  81 mg Oral Daily  .  [MAR Hold] benazepril  40 mg Oral Daily  . [MAR Hold] cephALEXin  500 mg Oral Q12H  . [MAR Hold] docusate sodium  100 mg Oral BID  . [MAR Hold] gemfibrozil  600 mg Oral Daily  . [MAR Hold] metoprolol tartrate  50 mg Oral BID  . [MAR Hold] pantoprazole  40 mg Oral Daily  . [MAR Hold] phenazopyridine  100 mg Oral TID WC  . [MAR Hold] predniSONE  5 mg Oral Q breakfast  . sodium chloride  3 mL Intravenous Q12H  . sodium chloride  3 mL Intravenous Q12H   Infusions:  . [START ON 10/04/2014] sodium chloride     Followed by  . [START ON 10/04/2014] sodium chloride    . sodium chloride    . dextrose 5 % and 0.9 % NaCl with KCl 20 mEq/L 75 mL/hr at 10/03/14 0636  . heparin 1,200 Units/hr (10/03/14 0151)    Labs:  Recent Labs  10/02/14 0033 10/03/14 0456  NA 140 140  K 4.0 3.8  CL 111 109  CO2 24 24  GLUCOSE 105* 105*  BUN 19 13  CREATININE 0.50 0.42*  CALCIUM 7.7* 7.8*  MG 2.4 2.3   No results for input(s): AST, ALT, ALKPHOS, BILITOT, PROT, ALBUMIN in the last 72 hours.  Recent Labs  10/01/14 0631 10/02/14 0033 10/03/14 0456  WBC 11.6* 11.8* 9.8  NEUTROABS 9.0*  --   --   HGB 9.9* 9.4* 10.0*  HCT 30.7* 29.1* 30.0*  MCV 82.3 83.5 83.8  PLT 215 211 204    Recent Labs  10/02/14 0646 10/02/14 2137 10/03/14 0456 10/03/14 1039  TROPONINI 3.05* 3.01* 1.82* 1.72*   Invalid input(s): POCBNP No  results for input(s): HGBA1C in the last 72 hours.   Weights: Filed Weights   09/29/14 1333 10/02/14 0341  Weight: 160 lb (72.576 kg) 175 lb 11.2 oz (79.697 kg)     Radiology/Studies:  Dg Chest 1 View  09/29/2014   CLINICAL DATA:  Two falls within the last week. Bilateral hip pain. History of hypertension, atrial fibrillation.  EXAM: CHEST  1 VIEW  COMPARISON:  12/15/2013  FINDINGS: Heart is mildly enlarged. The lungs are free of focal consolidations and pleural effusions. Mild perihilar peribronchial thickening. No pulmonary edema. No pneumothorax or acute displaced fractures.  Deformity of the right proximal humerus is consistent with old fracture.  IMPRESSION: 1. Cardiomegaly without pulmonary edema. 2. Bronchitic changes.  No focal acute pulmonary abnormality.   Electronically Signed   By: Norva Pavlov M.D.   On: 09/29/2014 15:03   Dg Pelvis 1-2 Views  09/29/2014   CLINICAL DATA:  Fall today with acute right hip pain. Initial encounter.  EXAM: PELVIS - 1-2 VIEW  COMPARISON:  None.  FINDINGS: A right femoral neck fracture is identified without subluxation or dislocation.  Diffuse osteopenia is noted.  Degenerative changes in the lower lumbar spine are present.  No suspicious focal bony lesions are noted.  IMPRESSION: Right femoral neck fracture.   Electronically Signed   By: Harmon Pier M.D.   On: 09/29/2014 14:49   Dg Hip Port Unilat With Pelvis 1v Right  09/30/2014   CLINICAL DATA:  Right hip hemiarthroplasty.  EXAM: DG HIP (WITH OR WITHOUT PELVIS) 1V PORT RIGHT  COMPARISON:  09/29/2014 radiograph  FINDINGS: Right hip hemiarthroplasty identified without complicating features.  There is no evidence of acute fracture or subluxation.  IMPRESSION: Right hip hemiarthroplasty without complicating features.   Electronically Signed   By: Harmon Pier M.D.   On: 09/30/2014 13:36   Dg Femur Min 2 Views Left  09/29/2014   CLINICAL DATA:  Acute fall today with left leg pain. Initial encounter.  EXAM: LEFT FEMUR 2 VIEWS  COMPARISON:  None.  FINDINGS: There is no evidence of acute fracture, subluxation or dislocation.  Mild degenerative changes within the hip and knee noted.  No focal bony lesions are identified.  IMPRESSION: No evidence of acute bony abnormality.   Electronically Signed   By: Harmon Pier M.D.   On: 09/29/2014 14:48   Dg Femur, Min 2 Views Right  09/29/2014   CLINICAL DATA:  Patient from home. Larey Seat this morning after tripping over her walker. Shortening of the right leg.  EXAM: RIGHT FEMUR 2 VIEWS  COMPARISON:  None.  FINDINGS: There is an acute impacted fracture of the  right subcapital femoral neck, associated with varus angulation and foreshortening of femur. No dislocation. Distal femur is intact.  IMPRESSION: Subcapital femoral neck fracture.   Electronically Signed   By: Norva Pavlov M.D.   On: 09/29/2014 14:47     Assessment and Recommendation  81 y.o. female with known essential hypertension and nonvalvular paroxysmal atrial fibrillation with acute and non-ST elevation myocardial infarction Catheterization showing normal LV systolic function with mild aortic valve stenosis and critical left main LAD circumflex and ramus artery stenosis likely causing above symptoms and myocardial infarction X 1. Restart heparin 2. Continue metoprolol for heart rate control and myocardial infarction 3. Continue medication management for chest discomfort including isosorbide 4. Consultation for coronary artery bypass surgery  Signed, Arnoldo Hooker M.D. FACC

## 2014-10-03 NOTE — Progress Notes (Signed)
Pt to transport to [facility] via [facility] for eval for CABG.  Report called to 7E floor, pt readied for transport and instructed of the plans for her care.  Agreeable to transfer to [facility].

## 2014-10-03 NOTE — Care Management (Signed)
Patient is for cardiac cath today.  If no intervention performed and elective medical management of findings, would anticipate discharge to skilled nursing 9/8 per attending.  Updated CSW

## 2014-10-04 ENCOUNTER — Encounter: Payer: Self-pay | Admitting: Surgery

## 2014-10-23 DIAGNOSIS — Z049 Encounter for examination and observation for unspecified reason: Secondary | ICD-10-CM | POA: Diagnosis not present

## 2014-10-23 DIAGNOSIS — Y33XXXA Other specified events, undetermined intent, initial encounter: Secondary | ICD-10-CM | POA: Diagnosis not present

## 2014-10-23 DIAGNOSIS — R0602 Shortness of breath: Secondary | ICD-10-CM | POA: Diagnosis not present

## 2014-10-23 DIAGNOSIS — R072 Precordial pain: Secondary | ICD-10-CM | POA: Diagnosis not present

## 2014-10-23 DIAGNOSIS — M069 Rheumatoid arthritis, unspecified: Secondary | ICD-10-CM | POA: Diagnosis not present

## 2014-10-23 DIAGNOSIS — R079 Chest pain, unspecified: Secondary | ICD-10-CM | POA: Diagnosis not present

## 2014-10-23 DIAGNOSIS — Z4682 Encounter for fitting and adjustment of non-vascular catheter: Secondary | ICD-10-CM | POA: Diagnosis not present

## 2014-10-23 DIAGNOSIS — Z8249 Family history of ischemic heart disease and other diseases of the circulatory system: Secondary | ICD-10-CM | POA: Diagnosis not present

## 2014-10-23 DIAGNOSIS — I6930 Unspecified sequelae of cerebral infarction: Secondary | ICD-10-CM | POA: Diagnosis not present

## 2014-10-23 DIAGNOSIS — M6250 Muscle wasting and atrophy, not elsewhere classified, unspecified site: Secondary | ICD-10-CM | POA: Diagnosis not present

## 2014-10-23 DIAGNOSIS — R262 Difficulty in walking, not elsewhere classified: Secondary | ICD-10-CM | POA: Diagnosis not present

## 2014-10-23 DIAGNOSIS — E785 Hyperlipidemia, unspecified: Secondary | ICD-10-CM | POA: Diagnosis present

## 2014-10-23 DIAGNOSIS — Z8042 Family history of malignant neoplasm of prostate: Secondary | ICD-10-CM | POA: Diagnosis not present

## 2014-10-23 DIAGNOSIS — M25512 Pain in left shoulder: Secondary | ICD-10-CM | POA: Diagnosis not present

## 2014-10-23 DIAGNOSIS — F419 Anxiety disorder, unspecified: Secondary | ICD-10-CM | POA: Diagnosis not present

## 2014-10-23 DIAGNOSIS — S72009A Fracture of unspecified part of neck of unspecified femur, initial encounter for closed fracture: Secondary | ICD-10-CM | POA: Diagnosis not present

## 2014-10-23 DIAGNOSIS — I4891 Unspecified atrial fibrillation: Secondary | ICD-10-CM | POA: Diagnosis not present

## 2014-10-23 DIAGNOSIS — I6350 Cerebral infarction due to unspecified occlusion or stenosis of unspecified cerebral artery: Secondary | ICD-10-CM | POA: Diagnosis not present

## 2014-10-23 DIAGNOSIS — K5900 Constipation, unspecified: Secondary | ICD-10-CM | POA: Diagnosis not present

## 2014-10-23 DIAGNOSIS — I482 Chronic atrial fibrillation: Secondary | ICD-10-CM | POA: Diagnosis not present

## 2014-10-23 DIAGNOSIS — I119 Hypertensive heart disease without heart failure: Secondary | ICD-10-CM | POA: Diagnosis not present

## 2014-10-23 DIAGNOSIS — B965 Pseudomonas (aeruginosa) (mallei) (pseudomallei) as the cause of diseases classified elsewhere: Secondary | ICD-10-CM | POA: Diagnosis present

## 2014-10-23 DIAGNOSIS — Z7901 Long term (current) use of anticoagulants: Secondary | ICD-10-CM | POA: Diagnosis not present

## 2014-10-23 DIAGNOSIS — T8131XA Disruption of external operation (surgical) wound, not elsewhere classified, initial encounter: Secondary | ICD-10-CM | POA: Diagnosis present

## 2014-10-23 DIAGNOSIS — I10 Essential (primary) hypertension: Secondary | ICD-10-CM | POA: Diagnosis not present

## 2014-10-23 DIAGNOSIS — S21101D Unspecified open wound of right front wall of thorax without penetration into thoracic cavity, subsequent encounter: Secondary | ICD-10-CM | POA: Diagnosis not present

## 2014-10-23 DIAGNOSIS — E784 Other hyperlipidemia: Secondary | ICD-10-CM | POA: Diagnosis not present

## 2014-10-23 DIAGNOSIS — Z952 Presence of prosthetic heart valve: Secondary | ICD-10-CM | POA: Diagnosis not present

## 2014-10-23 DIAGNOSIS — I2510 Atherosclerotic heart disease of native coronary artery without angina pectoris: Secondary | ICD-10-CM | POA: Diagnosis not present

## 2014-10-23 DIAGNOSIS — I480 Paroxysmal atrial fibrillation: Secondary | ICD-10-CM | POA: Diagnosis not present

## 2014-10-23 DIAGNOSIS — I214 Non-ST elevation (NSTEMI) myocardial infarction: Secondary | ICD-10-CM | POA: Diagnosis not present

## 2014-10-23 DIAGNOSIS — N189 Chronic kidney disease, unspecified: Secondary | ICD-10-CM | POA: Diagnosis not present

## 2014-10-23 DIAGNOSIS — I639 Cerebral infarction, unspecified: Secondary | ICD-10-CM | POA: Diagnosis not present

## 2014-10-23 DIAGNOSIS — I340 Nonrheumatic mitral (valve) insufficiency: Secondary | ICD-10-CM | POA: Diagnosis not present

## 2014-10-23 DIAGNOSIS — Z953 Presence of xenogenic heart valve: Secondary | ICD-10-CM | POA: Diagnosis not present

## 2014-10-23 DIAGNOSIS — I38 Endocarditis, valve unspecified: Secondary | ICD-10-CM | POA: Diagnosis not present

## 2014-10-23 DIAGNOSIS — I517 Cardiomegaly: Secondary | ICD-10-CM | POA: Diagnosis not present

## 2014-10-23 DIAGNOSIS — Z5189 Encounter for other specified aftercare: Secondary | ICD-10-CM | POA: Diagnosis not present

## 2014-10-23 DIAGNOSIS — H05029 Osteomyelitis of unspecified orbit: Secondary | ICD-10-CM | POA: Diagnosis not present

## 2014-10-23 DIAGNOSIS — I350 Nonrheumatic aortic (valve) stenosis: Secondary | ICD-10-CM | POA: Diagnosis not present

## 2014-10-23 DIAGNOSIS — I6523 Occlusion and stenosis of bilateral carotid arteries: Secondary | ICD-10-CM | POA: Diagnosis not present

## 2014-10-23 DIAGNOSIS — G8929 Other chronic pain: Secondary | ICD-10-CM | POA: Diagnosis not present

## 2014-10-23 DIAGNOSIS — Z8673 Personal history of transient ischemic attack (TIA), and cerebral infarction without residual deficits: Secondary | ICD-10-CM | POA: Diagnosis not present

## 2014-10-23 DIAGNOSIS — D500 Iron deficiency anemia secondary to blood loss (chronic): Secondary | ICD-10-CM | POA: Diagnosis not present

## 2014-10-23 DIAGNOSIS — Z96641 Presence of right artificial hip joint: Secondary | ICD-10-CM | POA: Diagnosis present

## 2014-10-23 DIAGNOSIS — D519 Vitamin B12 deficiency anemia, unspecified: Secondary | ICD-10-CM | POA: Diagnosis not present

## 2014-10-23 DIAGNOSIS — R1312 Dysphagia, oropharyngeal phase: Secondary | ICD-10-CM | POA: Diagnosis not present

## 2014-10-23 DIAGNOSIS — M0579 Rheumatoid arthritis with rheumatoid factor of multiple sites without organ or systems involvement: Secondary | ICD-10-CM | POA: Diagnosis not present

## 2014-10-23 DIAGNOSIS — Z823 Family history of stroke: Secondary | ICD-10-CM | POA: Diagnosis not present

## 2014-10-23 DIAGNOSIS — Z951 Presence of aortocoronary bypass graft: Secondary | ICD-10-CM | POA: Diagnosis not present

## 2014-10-23 DIAGNOSIS — F329 Major depressive disorder, single episode, unspecified: Secondary | ICD-10-CM | POA: Diagnosis not present

## 2014-10-23 DIAGNOSIS — D649 Anemia, unspecified: Secondary | ICD-10-CM | POA: Diagnosis not present

## 2014-10-23 DIAGNOSIS — T814XXA Infection following a procedure, initial encounter: Secondary | ICD-10-CM | POA: Diagnosis not present

## 2014-10-23 DIAGNOSIS — M810 Age-related osteoporosis without current pathological fracture: Secondary | ICD-10-CM | POA: Diagnosis present

## 2014-10-23 DIAGNOSIS — E876 Hypokalemia: Secondary | ICD-10-CM | POA: Diagnosis not present

## 2014-10-23 DIAGNOSIS — R110 Nausea: Secondary | ICD-10-CM | POA: Diagnosis not present

## 2014-10-23 DIAGNOSIS — M6281 Muscle weakness (generalized): Secondary | ICD-10-CM | POA: Diagnosis not present

## 2014-10-23 DIAGNOSIS — R918 Other nonspecific abnormal finding of lung field: Secondary | ICD-10-CM | POA: Diagnosis not present

## 2014-10-23 DIAGNOSIS — I25708 Atherosclerosis of coronary artery bypass graft(s), unspecified, with other forms of angina pectoris: Secondary | ICD-10-CM | POA: Diagnosis not present

## 2014-10-23 DIAGNOSIS — M25559 Pain in unspecified hip: Secondary | ICD-10-CM | POA: Diagnosis not present

## 2014-10-25 DIAGNOSIS — E784 Other hyperlipidemia: Secondary | ICD-10-CM | POA: Diagnosis not present

## 2014-10-25 DIAGNOSIS — I2510 Atherosclerotic heart disease of native coronary artery without angina pectoris: Secondary | ICD-10-CM | POA: Diagnosis not present

## 2014-10-25 DIAGNOSIS — I4891 Unspecified atrial fibrillation: Secondary | ICD-10-CM | POA: Diagnosis not present

## 2014-10-25 DIAGNOSIS — I10 Essential (primary) hypertension: Secondary | ICD-10-CM | POA: Diagnosis not present

## 2014-10-25 DIAGNOSIS — M810 Age-related osteoporosis without current pathological fracture: Secondary | ICD-10-CM | POA: Diagnosis not present

## 2014-10-25 DIAGNOSIS — D649 Anemia, unspecified: Secondary | ICD-10-CM | POA: Diagnosis not present

## 2014-11-01 DIAGNOSIS — D649 Anemia, unspecified: Secondary | ICD-10-CM | POA: Diagnosis not present

## 2014-11-01 DIAGNOSIS — R110 Nausea: Secondary | ICD-10-CM | POA: Diagnosis not present

## 2014-11-01 DIAGNOSIS — I10 Essential (primary) hypertension: Secondary | ICD-10-CM | POA: Diagnosis not present

## 2014-11-01 DIAGNOSIS — M25559 Pain in unspecified hip: Secondary | ICD-10-CM | POA: Diagnosis not present

## 2014-11-01 DIAGNOSIS — I6350 Cerebral infarction due to unspecified occlusion or stenosis of unspecified cerebral artery: Secondary | ICD-10-CM | POA: Diagnosis not present

## 2014-11-01 DIAGNOSIS — I4891 Unspecified atrial fibrillation: Secondary | ICD-10-CM | POA: Diagnosis not present

## 2014-11-01 DIAGNOSIS — I2510 Atherosclerotic heart disease of native coronary artery without angina pectoris: Secondary | ICD-10-CM | POA: Diagnosis not present

## 2014-11-08 DIAGNOSIS — R918 Other nonspecific abnormal finding of lung field: Secondary | ICD-10-CM | POA: Diagnosis not present

## 2014-11-08 DIAGNOSIS — I2510 Atherosclerotic heart disease of native coronary artery without angina pectoris: Secondary | ICD-10-CM | POA: Diagnosis not present

## 2014-11-08 DIAGNOSIS — I517 Cardiomegaly: Secondary | ICD-10-CM | POA: Diagnosis not present

## 2014-11-10 DIAGNOSIS — I4891 Unspecified atrial fibrillation: Secondary | ICD-10-CM | POA: Diagnosis not present

## 2014-11-10 DIAGNOSIS — F329 Major depressive disorder, single episode, unspecified: Secondary | ICD-10-CM | POA: Diagnosis not present

## 2014-11-10 DIAGNOSIS — E784 Other hyperlipidemia: Secondary | ICD-10-CM | POA: Diagnosis not present

## 2014-11-10 DIAGNOSIS — F419 Anxiety disorder, unspecified: Secondary | ICD-10-CM | POA: Diagnosis not present

## 2014-11-10 DIAGNOSIS — D649 Anemia, unspecified: Secondary | ICD-10-CM | POA: Diagnosis not present

## 2014-11-10 DIAGNOSIS — M810 Age-related osteoporosis without current pathological fracture: Secondary | ICD-10-CM | POA: Diagnosis not present

## 2014-11-10 DIAGNOSIS — I6350 Cerebral infarction due to unspecified occlusion or stenosis of unspecified cerebral artery: Secondary | ICD-10-CM | POA: Diagnosis not present

## 2014-11-10 DIAGNOSIS — I10 Essential (primary) hypertension: Secondary | ICD-10-CM | POA: Diagnosis not present

## 2014-11-16 DIAGNOSIS — I6930 Unspecified sequelae of cerebral infarction: Secondary | ICD-10-CM | POA: Diagnosis not present

## 2014-11-18 DIAGNOSIS — F329 Major depressive disorder, single episode, unspecified: Secondary | ICD-10-CM | POA: Diagnosis not present

## 2014-11-18 DIAGNOSIS — I10 Essential (primary) hypertension: Secondary | ICD-10-CM | POA: Diagnosis not present

## 2014-11-18 DIAGNOSIS — D519 Vitamin B12 deficiency anemia, unspecified: Secondary | ICD-10-CM | POA: Diagnosis not present

## 2014-11-18 DIAGNOSIS — F419 Anxiety disorder, unspecified: Secondary | ICD-10-CM | POA: Diagnosis not present

## 2014-11-18 DIAGNOSIS — I2510 Atherosclerotic heart disease of native coronary artery without angina pectoris: Secondary | ICD-10-CM | POA: Diagnosis not present

## 2014-11-18 DIAGNOSIS — I4891 Unspecified atrial fibrillation: Secondary | ICD-10-CM | POA: Diagnosis not present

## 2014-11-18 DIAGNOSIS — I6350 Cerebral infarction due to unspecified occlusion or stenosis of unspecified cerebral artery: Secondary | ICD-10-CM | POA: Diagnosis not present

## 2014-11-18 DIAGNOSIS — E784 Other hyperlipidemia: Secondary | ICD-10-CM | POA: Diagnosis not present

## 2014-11-26 DIAGNOSIS — I38 Endocarditis, valve unspecified: Secondary | ICD-10-CM | POA: Diagnosis not present

## 2014-11-26 DIAGNOSIS — I119 Hypertensive heart disease without heart failure: Secondary | ICD-10-CM | POA: Diagnosis not present

## 2014-11-26 DIAGNOSIS — I340 Nonrheumatic mitral (valve) insufficiency: Secondary | ICD-10-CM | POA: Diagnosis not present

## 2014-11-26 DIAGNOSIS — I6523 Occlusion and stenosis of bilateral carotid arteries: Secondary | ICD-10-CM | POA: Diagnosis not present

## 2014-11-26 DIAGNOSIS — I2510 Atherosclerotic heart disease of native coronary artery without angina pectoris: Secondary | ICD-10-CM | POA: Diagnosis not present

## 2014-11-26 DIAGNOSIS — D649 Anemia, unspecified: Secondary | ICD-10-CM | POA: Diagnosis not present

## 2014-11-26 DIAGNOSIS — I10 Essential (primary) hypertension: Secondary | ICD-10-CM | POA: Diagnosis not present

## 2014-11-26 DIAGNOSIS — R110 Nausea: Secondary | ICD-10-CM | POA: Diagnosis not present

## 2014-11-26 DIAGNOSIS — I480 Paroxysmal atrial fibrillation: Secondary | ICD-10-CM | POA: Diagnosis not present

## 2014-11-26 DIAGNOSIS — M069 Rheumatoid arthritis, unspecified: Secondary | ICD-10-CM | POA: Diagnosis not present

## 2014-11-26 DIAGNOSIS — I350 Nonrheumatic aortic (valve) stenosis: Secondary | ICD-10-CM | POA: Diagnosis not present

## 2014-11-26 DIAGNOSIS — I6350 Cerebral infarction due to unspecified occlusion or stenosis of unspecified cerebral artery: Secondary | ICD-10-CM | POA: Diagnosis not present

## 2014-11-26 DIAGNOSIS — I4891 Unspecified atrial fibrillation: Secondary | ICD-10-CM | POA: Diagnosis not present

## 2014-11-26 DIAGNOSIS — I482 Chronic atrial fibrillation: Secondary | ICD-10-CM | POA: Diagnosis not present

## 2014-12-06 DIAGNOSIS — R072 Precordial pain: Secondary | ICD-10-CM | POA: Diagnosis not present

## 2014-12-06 DIAGNOSIS — E784 Other hyperlipidemia: Secondary | ICD-10-CM | POA: Diagnosis not present

## 2014-12-06 DIAGNOSIS — I2510 Atherosclerotic heart disease of native coronary artery without angina pectoris: Secondary | ICD-10-CM | POA: Diagnosis not present

## 2014-12-06 DIAGNOSIS — M810 Age-related osteoporosis without current pathological fracture: Secondary | ICD-10-CM | POA: Diagnosis not present

## 2014-12-06 DIAGNOSIS — I350 Nonrheumatic aortic (valve) stenosis: Secondary | ICD-10-CM | POA: Diagnosis not present

## 2014-12-06 DIAGNOSIS — I6350 Cerebral infarction due to unspecified occlusion or stenosis of unspecified cerebral artery: Secondary | ICD-10-CM | POA: Diagnosis not present

## 2014-12-06 DIAGNOSIS — I119 Hypertensive heart disease without heart failure: Secondary | ICD-10-CM | POA: Diagnosis not present

## 2014-12-06 DIAGNOSIS — Z951 Presence of aortocoronary bypass graft: Secondary | ICD-10-CM | POA: Diagnosis not present

## 2014-12-06 DIAGNOSIS — R0602 Shortness of breath: Secondary | ICD-10-CM | POA: Diagnosis not present

## 2014-12-06 DIAGNOSIS — I25708 Atherosclerosis of coronary artery bypass graft(s), unspecified, with other forms of angina pectoris: Secondary | ICD-10-CM | POA: Diagnosis not present

## 2014-12-06 DIAGNOSIS — D649 Anemia, unspecified: Secondary | ICD-10-CM | POA: Diagnosis not present

## 2014-12-06 DIAGNOSIS — I4891 Unspecified atrial fibrillation: Secondary | ICD-10-CM | POA: Diagnosis not present

## 2014-12-06 DIAGNOSIS — S72009A Fracture of unspecified part of neck of unspecified femur, initial encounter for closed fracture: Secondary | ICD-10-CM | POA: Diagnosis not present

## 2014-12-06 DIAGNOSIS — R079 Chest pain, unspecified: Secondary | ICD-10-CM | POA: Diagnosis not present

## 2014-12-06 DIAGNOSIS — I10 Essential (primary) hypertension: Secondary | ICD-10-CM | POA: Diagnosis not present

## 2014-12-13 DIAGNOSIS — M25512 Pain in left shoulder: Secondary | ICD-10-CM | POA: Diagnosis not present

## 2014-12-13 DIAGNOSIS — M810 Age-related osteoporosis without current pathological fracture: Secondary | ICD-10-CM | POA: Diagnosis not present

## 2014-12-13 DIAGNOSIS — G8929 Other chronic pain: Secondary | ICD-10-CM | POA: Diagnosis not present

## 2014-12-13 DIAGNOSIS — M0579 Rheumatoid arthritis with rheumatoid factor of multiple sites without organ or systems involvement: Secondary | ICD-10-CM | POA: Diagnosis not present

## 2014-12-18 DIAGNOSIS — K5900 Constipation, unspecified: Secondary | ICD-10-CM | POA: Diagnosis not present

## 2014-12-18 DIAGNOSIS — I6350 Cerebral infarction due to unspecified occlusion or stenosis of unspecified cerebral artery: Secondary | ICD-10-CM | POA: Diagnosis not present

## 2014-12-18 DIAGNOSIS — D649 Anemia, unspecified: Secondary | ICD-10-CM | POA: Diagnosis not present

## 2014-12-18 DIAGNOSIS — I2510 Atherosclerotic heart disease of native coronary artery without angina pectoris: Secondary | ICD-10-CM | POA: Diagnosis not present

## 2014-12-18 DIAGNOSIS — I10 Essential (primary) hypertension: Secondary | ICD-10-CM | POA: Diagnosis not present

## 2014-12-18 DIAGNOSIS — M810 Age-related osteoporosis without current pathological fracture: Secondary | ICD-10-CM | POA: Diagnosis not present

## 2014-12-18 DIAGNOSIS — M069 Rheumatoid arthritis, unspecified: Secondary | ICD-10-CM | POA: Diagnosis not present

## 2014-12-18 DIAGNOSIS — I4891 Unspecified atrial fibrillation: Secondary | ICD-10-CM | POA: Diagnosis not present

## 2014-12-25 DIAGNOSIS — I119 Hypertensive heart disease without heart failure: Secondary | ICD-10-CM | POA: Diagnosis present

## 2014-12-25 DIAGNOSIS — Y33XXXA Other specified events, undetermined intent, initial encounter: Secondary | ICD-10-CM | POA: Diagnosis not present

## 2014-12-25 DIAGNOSIS — T814XXA Infection following a procedure, initial encounter: Secondary | ICD-10-CM | POA: Diagnosis not present

## 2014-12-25 DIAGNOSIS — I4891 Unspecified atrial fibrillation: Secondary | ICD-10-CM | POA: Diagnosis not present

## 2014-12-25 DIAGNOSIS — Z953 Presence of xenogenic heart valve: Secondary | ICD-10-CM | POA: Diagnosis not present

## 2014-12-25 DIAGNOSIS — Z951 Presence of aortocoronary bypass graft: Secondary | ICD-10-CM | POA: Diagnosis not present

## 2014-12-25 DIAGNOSIS — I350 Nonrheumatic aortic (valve) stenosis: Secondary | ICD-10-CM | POA: Diagnosis present

## 2014-12-25 DIAGNOSIS — R262 Difficulty in walking, not elsewhere classified: Secondary | ICD-10-CM | POA: Diagnosis not present

## 2014-12-25 DIAGNOSIS — Z8249 Family history of ischemic heart disease and other diseases of the circulatory system: Secondary | ICD-10-CM | POA: Diagnosis not present

## 2014-12-25 DIAGNOSIS — T8131XA Disruption of external operation (surgical) wound, not elsewhere classified, initial encounter: Secondary | ICD-10-CM | POA: Diagnosis present

## 2014-12-25 DIAGNOSIS — Z481 Encounter for planned postprocedural wound closure: Secondary | ICD-10-CM | POA: Diagnosis not present

## 2014-12-25 DIAGNOSIS — R918 Other nonspecific abnormal finding of lung field: Secondary | ICD-10-CM | POA: Diagnosis not present

## 2014-12-25 DIAGNOSIS — M810 Age-related osteoporosis without current pathological fracture: Secondary | ICD-10-CM | POA: Diagnosis present

## 2014-12-25 DIAGNOSIS — Z7901 Long term (current) use of anticoagulants: Secondary | ICD-10-CM | POA: Diagnosis not present

## 2014-12-25 DIAGNOSIS — I639 Cerebral infarction, unspecified: Secondary | ICD-10-CM | POA: Diagnosis not present

## 2014-12-25 DIAGNOSIS — Z96641 Presence of right artificial hip joint: Secondary | ICD-10-CM | POA: Diagnosis present

## 2014-12-25 DIAGNOSIS — I2510 Atherosclerotic heart disease of native coronary artery without angina pectoris: Secondary | ICD-10-CM | POA: Diagnosis present

## 2014-12-25 DIAGNOSIS — F05 Delirium due to known physiological condition: Secondary | ICD-10-CM | POA: Diagnosis not present

## 2014-12-25 DIAGNOSIS — M6250 Muscle wasting and atrophy, not elsewhere classified, unspecified site: Secondary | ICD-10-CM | POA: Diagnosis not present

## 2014-12-25 DIAGNOSIS — E785 Hyperlipidemia, unspecified: Secondary | ICD-10-CM | POA: Diagnosis present

## 2014-12-25 DIAGNOSIS — M6281 Muscle weakness (generalized): Secondary | ICD-10-CM | POA: Diagnosis not present

## 2014-12-25 DIAGNOSIS — B965 Pseudomonas (aeruginosa) (mallei) (pseudomallei) as the cause of diseases classified elsewhere: Secondary | ICD-10-CM | POA: Diagnosis not present

## 2014-12-25 DIAGNOSIS — I517 Cardiomegaly: Secondary | ICD-10-CM | POA: Diagnosis not present

## 2014-12-25 DIAGNOSIS — S21101D Unspecified open wound of right front wall of thorax without penetration into thoracic cavity, subsequent encounter: Secondary | ICD-10-CM | POA: Diagnosis not present

## 2014-12-25 DIAGNOSIS — Z8673 Personal history of transient ischemic attack (TIA), and cerebral infarction without residual deficits: Secondary | ICD-10-CM | POA: Diagnosis not present

## 2014-12-25 DIAGNOSIS — Z8042 Family history of malignant neoplasm of prostate: Secondary | ICD-10-CM | POA: Diagnosis not present

## 2014-12-25 DIAGNOSIS — I480 Paroxysmal atrial fibrillation: Secondary | ICD-10-CM | POA: Diagnosis present

## 2014-12-25 DIAGNOSIS — B958 Unspecified staphylococcus as the cause of diseases classified elsewhere: Secondary | ICD-10-CM | POA: Diagnosis not present

## 2014-12-25 DIAGNOSIS — I340 Nonrheumatic mitral (valve) insufficiency: Secondary | ICD-10-CM | POA: Diagnosis present

## 2014-12-25 DIAGNOSIS — M069 Rheumatoid arthritis, unspecified: Secondary | ICD-10-CM | POA: Diagnosis present

## 2014-12-25 DIAGNOSIS — R768 Other specified abnormal immunological findings in serum: Secondary | ICD-10-CM | POA: Diagnosis not present

## 2014-12-25 DIAGNOSIS — Z952 Presence of prosthetic heart valve: Secondary | ICD-10-CM | POA: Diagnosis not present

## 2014-12-25 DIAGNOSIS — Z5189 Encounter for other specified aftercare: Secondary | ICD-10-CM | POA: Diagnosis not present

## 2014-12-25 DIAGNOSIS — S72009A Fracture of unspecified part of neck of unspecified femur, initial encounter for closed fracture: Secondary | ICD-10-CM | POA: Diagnosis not present

## 2014-12-25 DIAGNOSIS — I214 Non-ST elevation (NSTEMI) myocardial infarction: Secondary | ICD-10-CM | POA: Diagnosis not present

## 2014-12-25 DIAGNOSIS — Z4682 Encounter for fitting and adjustment of non-vascular catheter: Secondary | ICD-10-CM | POA: Diagnosis not present

## 2014-12-25 DIAGNOSIS — Z823 Family history of stroke: Secondary | ICD-10-CM | POA: Diagnosis not present

## 2014-12-25 DIAGNOSIS — I6350 Cerebral infarction due to unspecified occlusion or stenosis of unspecified cerebral artery: Secondary | ICD-10-CM | POA: Diagnosis not present

## 2014-12-25 DIAGNOSIS — Z049 Encounter for examination and observation for unspecified reason: Secondary | ICD-10-CM | POA: Diagnosis not present

## 2015-01-02 DIAGNOSIS — R768 Other specified abnormal immunological findings in serum: Secondary | ICD-10-CM | POA: Diagnosis not present

## 2015-01-04 DIAGNOSIS — F419 Anxiety disorder, unspecified: Secondary | ICD-10-CM | POA: Diagnosis not present

## 2015-01-04 DIAGNOSIS — R918 Other nonspecific abnormal finding of lung field: Secondary | ICD-10-CM | POA: Diagnosis not present

## 2015-01-04 DIAGNOSIS — F05 Delirium due to known physiological condition: Secondary | ICD-10-CM | POA: Diagnosis not present

## 2015-01-04 DIAGNOSIS — I214 Non-ST elevation (NSTEMI) myocardial infarction: Secondary | ICD-10-CM | POA: Diagnosis not present

## 2015-01-04 DIAGNOSIS — W19XXXA Unspecified fall, initial encounter: Secondary | ICD-10-CM | POA: Diagnosis not present

## 2015-01-04 DIAGNOSIS — M810 Age-related osteoporosis without current pathological fracture: Secondary | ICD-10-CM | POA: Diagnosis not present

## 2015-01-04 DIAGNOSIS — R0781 Pleurodynia: Secondary | ICD-10-CM | POA: Diagnosis not present

## 2015-01-04 DIAGNOSIS — I350 Nonrheumatic aortic (valve) stenosis: Secondary | ICD-10-CM | POA: Diagnosis not present

## 2015-01-04 DIAGNOSIS — D649 Anemia, unspecified: Secondary | ICD-10-CM | POA: Diagnosis not present

## 2015-01-04 DIAGNOSIS — M6281 Muscle weakness (generalized): Secondary | ICD-10-CM | POA: Diagnosis not present

## 2015-01-04 DIAGNOSIS — I4891 Unspecified atrial fibrillation: Secondary | ICD-10-CM | POA: Diagnosis not present

## 2015-01-04 DIAGNOSIS — Z5189 Encounter for other specified aftercare: Secondary | ICD-10-CM | POA: Diagnosis not present

## 2015-01-04 DIAGNOSIS — Z9889 Other specified postprocedural states: Secondary | ICD-10-CM | POA: Diagnosis not present

## 2015-01-04 DIAGNOSIS — J9811 Atelectasis: Secondary | ICD-10-CM | POA: Diagnosis not present

## 2015-01-04 DIAGNOSIS — I2510 Atherosclerotic heart disease of native coronary artery without angina pectoris: Secondary | ICD-10-CM | POA: Diagnosis not present

## 2015-01-04 DIAGNOSIS — E784 Other hyperlipidemia: Secondary | ICD-10-CM | POA: Diagnosis not present

## 2015-01-04 DIAGNOSIS — Z951 Presence of aortocoronary bypass graft: Secondary | ICD-10-CM | POA: Diagnosis not present

## 2015-01-04 DIAGNOSIS — I10 Essential (primary) hypertension: Secondary | ICD-10-CM | POA: Diagnosis not present

## 2015-01-04 DIAGNOSIS — T814XXD Infection following a procedure, subsequent encounter: Secondary | ICD-10-CM | POA: Diagnosis not present

## 2015-01-04 DIAGNOSIS — I4510 Unspecified right bundle-branch block: Secondary | ICD-10-CM | POA: Diagnosis not present

## 2015-01-04 DIAGNOSIS — R262 Difficulty in walking, not elsewhere classified: Secondary | ICD-10-CM | POA: Diagnosis not present

## 2015-01-04 DIAGNOSIS — M25559 Pain in unspecified hip: Secondary | ICD-10-CM | POA: Diagnosis not present

## 2015-01-04 DIAGNOSIS — K5900 Constipation, unspecified: Secondary | ICD-10-CM | POA: Diagnosis not present

## 2015-01-04 DIAGNOSIS — I639 Cerebral infarction, unspecified: Secondary | ICD-10-CM | POA: Diagnosis not present

## 2015-01-04 DIAGNOSIS — I6350 Cerebral infarction due to unspecified occlusion or stenosis of unspecified cerebral artery: Secondary | ICD-10-CM | POA: Diagnosis not present

## 2015-01-04 DIAGNOSIS — I517 Cardiomegaly: Secondary | ICD-10-CM | POA: Diagnosis not present

## 2015-01-04 DIAGNOSIS — S72009A Fracture of unspecified part of neck of unspecified femur, initial encounter for closed fracture: Secondary | ICD-10-CM | POA: Diagnosis not present

## 2015-01-04 DIAGNOSIS — M069 Rheumatoid arthritis, unspecified: Secondary | ICD-10-CM | POA: Diagnosis not present

## 2015-01-04 DIAGNOSIS — M6250 Muscle wasting and atrophy, not elsewhere classified, unspecified site: Secondary | ICD-10-CM | POA: Diagnosis not present

## 2015-01-08 DIAGNOSIS — M069 Rheumatoid arthritis, unspecified: Secondary | ICD-10-CM | POA: Diagnosis not present

## 2015-01-08 DIAGNOSIS — I10 Essential (primary) hypertension: Secondary | ICD-10-CM | POA: Diagnosis not present

## 2015-01-08 DIAGNOSIS — S72009A Fracture of unspecified part of neck of unspecified femur, initial encounter for closed fracture: Secondary | ICD-10-CM | POA: Diagnosis not present

## 2015-01-08 DIAGNOSIS — T814XXD Infection following a procedure, subsequent encounter: Secondary | ICD-10-CM | POA: Diagnosis not present

## 2015-01-08 DIAGNOSIS — M810 Age-related osteoporosis without current pathological fracture: Secondary | ICD-10-CM | POA: Diagnosis not present

## 2015-01-08 DIAGNOSIS — I4891 Unspecified atrial fibrillation: Secondary | ICD-10-CM | POA: Diagnosis not present

## 2015-01-08 DIAGNOSIS — E784 Other hyperlipidemia: Secondary | ICD-10-CM | POA: Diagnosis not present

## 2015-01-08 DIAGNOSIS — I2510 Atherosclerotic heart disease of native coronary artery without angina pectoris: Secondary | ICD-10-CM | POA: Diagnosis not present

## 2015-01-15 DIAGNOSIS — I10 Essential (primary) hypertension: Secondary | ICD-10-CM | POA: Diagnosis not present

## 2015-01-15 DIAGNOSIS — E784 Other hyperlipidemia: Secondary | ICD-10-CM | POA: Diagnosis not present

## 2015-01-15 DIAGNOSIS — I6350 Cerebral infarction due to unspecified occlusion or stenosis of unspecified cerebral artery: Secondary | ICD-10-CM | POA: Diagnosis not present

## 2015-01-15 DIAGNOSIS — I2510 Atherosclerotic heart disease of native coronary artery without angina pectoris: Secondary | ICD-10-CM | POA: Diagnosis not present

## 2015-01-15 DIAGNOSIS — I4891 Unspecified atrial fibrillation: Secondary | ICD-10-CM | POA: Diagnosis not present

## 2015-01-15 DIAGNOSIS — K5900 Constipation, unspecified: Secondary | ICD-10-CM | POA: Diagnosis not present

## 2015-01-15 DIAGNOSIS — M810 Age-related osteoporosis without current pathological fracture: Secondary | ICD-10-CM | POA: Diagnosis not present

## 2015-01-15 DIAGNOSIS — D649 Anemia, unspecified: Secondary | ICD-10-CM | POA: Diagnosis not present

## 2015-01-17 DIAGNOSIS — Z951 Presence of aortocoronary bypass graft: Secondary | ICD-10-CM | POA: Diagnosis not present

## 2015-01-17 DIAGNOSIS — J9811 Atelectasis: Secondary | ICD-10-CM | POA: Diagnosis not present

## 2015-01-17 DIAGNOSIS — R918 Other nonspecific abnormal finding of lung field: Secondary | ICD-10-CM | POA: Diagnosis not present

## 2015-01-24 DIAGNOSIS — M069 Rheumatoid arthritis, unspecified: Secondary | ICD-10-CM | POA: Diagnosis not present

## 2015-01-24 DIAGNOSIS — D649 Anemia, unspecified: Secondary | ICD-10-CM | POA: Diagnosis not present

## 2015-01-24 DIAGNOSIS — I4891 Unspecified atrial fibrillation: Secondary | ICD-10-CM | POA: Diagnosis not present

## 2015-01-24 DIAGNOSIS — M25559 Pain in unspecified hip: Secondary | ICD-10-CM | POA: Diagnosis not present

## 2015-01-24 DIAGNOSIS — F419 Anxiety disorder, unspecified: Secondary | ICD-10-CM | POA: Diagnosis not present

## 2015-01-24 DIAGNOSIS — I6350 Cerebral infarction due to unspecified occlusion or stenosis of unspecified cerebral artery: Secondary | ICD-10-CM | POA: Diagnosis not present

## 2015-01-24 DIAGNOSIS — I10 Essential (primary) hypertension: Secondary | ICD-10-CM | POA: Diagnosis not present

## 2015-01-24 DIAGNOSIS — I2510 Atherosclerotic heart disease of native coronary artery without angina pectoris: Secondary | ICD-10-CM | POA: Diagnosis not present

## 2015-02-04 DIAGNOSIS — M810 Age-related osteoporosis without current pathological fracture: Secondary | ICD-10-CM | POA: Diagnosis not present

## 2015-02-04 DIAGNOSIS — D649 Anemia, unspecified: Secondary | ICD-10-CM | POA: Diagnosis not present

## 2015-02-04 DIAGNOSIS — I10 Essential (primary) hypertension: Secondary | ICD-10-CM | POA: Diagnosis not present

## 2015-02-04 DIAGNOSIS — E784 Other hyperlipidemia: Secondary | ICD-10-CM | POA: Diagnosis not present

## 2015-02-04 DIAGNOSIS — I4891 Unspecified atrial fibrillation: Secondary | ICD-10-CM | POA: Diagnosis not present

## 2015-02-04 DIAGNOSIS — I2510 Atherosclerotic heart disease of native coronary artery without angina pectoris: Secondary | ICD-10-CM | POA: Diagnosis not present

## 2015-02-04 DIAGNOSIS — M069 Rheumatoid arthritis, unspecified: Secondary | ICD-10-CM | POA: Diagnosis not present

## 2015-02-04 DIAGNOSIS — I6350 Cerebral infarction due to unspecified occlusion or stenosis of unspecified cerebral artery: Secondary | ICD-10-CM | POA: Diagnosis not present

## 2015-02-12 DIAGNOSIS — I2510 Atherosclerotic heart disease of native coronary artery without angina pectoris: Secondary | ICD-10-CM | POA: Diagnosis not present

## 2015-02-12 DIAGNOSIS — M810 Age-related osteoporosis without current pathological fracture: Secondary | ICD-10-CM | POA: Diagnosis not present

## 2015-02-12 DIAGNOSIS — M0579 Rheumatoid arthritis with rheumatoid factor of multiple sites without organ or systems involvement: Secondary | ICD-10-CM | POA: Diagnosis not present

## 2015-02-18 DIAGNOSIS — K5900 Constipation, unspecified: Secondary | ICD-10-CM | POA: Diagnosis not present

## 2015-02-18 DIAGNOSIS — I4891 Unspecified atrial fibrillation: Secondary | ICD-10-CM | POA: Diagnosis not present

## 2015-02-18 DIAGNOSIS — I2510 Atherosclerotic heart disease of native coronary artery without angina pectoris: Secondary | ICD-10-CM | POA: Diagnosis not present

## 2015-02-18 DIAGNOSIS — E784 Other hyperlipidemia: Secondary | ICD-10-CM | POA: Diagnosis not present

## 2015-02-18 DIAGNOSIS — I10 Essential (primary) hypertension: Secondary | ICD-10-CM | POA: Diagnosis not present

## 2015-02-18 DIAGNOSIS — I6350 Cerebral infarction due to unspecified occlusion or stenosis of unspecified cerebral artery: Secondary | ICD-10-CM | POA: Diagnosis not present

## 2015-02-18 DIAGNOSIS — D649 Anemia, unspecified: Secondary | ICD-10-CM | POA: Diagnosis not present

## 2015-02-18 DIAGNOSIS — M069 Rheumatoid arthritis, unspecified: Secondary | ICD-10-CM | POA: Diagnosis not present

## 2015-02-28 DIAGNOSIS — T8131XA Disruption of external operation (surgical) wound, not elsewhere classified, initial encounter: Secondary | ICD-10-CM | POA: Diagnosis not present

## 2015-03-06 DIAGNOSIS — N390 Urinary tract infection, site not specified: Secondary | ICD-10-CM | POA: Diagnosis not present

## 2015-03-06 DIAGNOSIS — D518 Other vitamin B12 deficiency anemias: Secondary | ICD-10-CM | POA: Diagnosis not present

## 2015-03-06 DIAGNOSIS — D519 Vitamin B12 deficiency anemia, unspecified: Secondary | ICD-10-CM | POA: Diagnosis not present

## 2015-03-06 DIAGNOSIS — I10 Essential (primary) hypertension: Secondary | ICD-10-CM | POA: Diagnosis not present

## 2015-03-06 DIAGNOSIS — D509 Iron deficiency anemia, unspecified: Secondary | ICD-10-CM | POA: Diagnosis not present

## 2015-03-07 DIAGNOSIS — T814XXD Infection following a procedure, subsequent encounter: Secondary | ICD-10-CM | POA: Diagnosis not present

## 2015-03-07 DIAGNOSIS — Z951 Presence of aortocoronary bypass graft: Secondary | ICD-10-CM | POA: Diagnosis not present

## 2015-03-13 DIAGNOSIS — M810 Age-related osteoporosis without current pathological fracture: Secondary | ICD-10-CM | POA: Diagnosis not present

## 2015-03-13 DIAGNOSIS — I4891 Unspecified atrial fibrillation: Secondary | ICD-10-CM | POA: Diagnosis not present

## 2015-03-13 DIAGNOSIS — E784 Other hyperlipidemia: Secondary | ICD-10-CM | POA: Diagnosis not present

## 2015-03-13 DIAGNOSIS — I2510 Atherosclerotic heart disease of native coronary artery without angina pectoris: Secondary | ICD-10-CM | POA: Diagnosis not present

## 2015-03-13 DIAGNOSIS — I10 Essential (primary) hypertension: Secondary | ICD-10-CM | POA: Diagnosis not present

## 2015-03-13 DIAGNOSIS — T814XXD Infection following a procedure, subsequent encounter: Secondary | ICD-10-CM | POA: Diagnosis not present

## 2015-03-13 DIAGNOSIS — D649 Anemia, unspecified: Secondary | ICD-10-CM | POA: Diagnosis not present

## 2015-03-14 DIAGNOSIS — M869 Osteomyelitis, unspecified: Secondary | ICD-10-CM | POA: Diagnosis not present

## 2015-03-14 DIAGNOSIS — M6250 Muscle wasting and atrophy, not elsewhere classified, unspecified site: Secondary | ICD-10-CM | POA: Diagnosis not present

## 2015-03-14 DIAGNOSIS — Z953 Presence of xenogenic heart valve: Secondary | ICD-10-CM | POA: Diagnosis not present

## 2015-03-14 DIAGNOSIS — M810 Age-related osteoporosis without current pathological fracture: Secondary | ICD-10-CM | POA: Diagnosis present

## 2015-03-14 DIAGNOSIS — T8189XA Other complications of procedures, not elsewhere classified, initial encounter: Secondary | ICD-10-CM | POA: Diagnosis not present

## 2015-03-14 DIAGNOSIS — S72009A Fracture of unspecified part of neck of unspecified femur, initial encounter for closed fracture: Secondary | ICD-10-CM | POA: Diagnosis not present

## 2015-03-14 DIAGNOSIS — I252 Old myocardial infarction: Secondary | ICD-10-CM | POA: Diagnosis not present

## 2015-03-14 DIAGNOSIS — I517 Cardiomegaly: Secondary | ICD-10-CM | POA: Diagnosis not present

## 2015-03-14 DIAGNOSIS — Z952 Presence of prosthetic heart valve: Secondary | ICD-10-CM | POA: Diagnosis not present

## 2015-03-14 DIAGNOSIS — J479 Bronchiectasis, uncomplicated: Secondary | ICD-10-CM | POA: Diagnosis not present

## 2015-03-14 DIAGNOSIS — A498 Other bacterial infections of unspecified site: Secondary | ICD-10-CM | POA: Diagnosis not present

## 2015-03-14 DIAGNOSIS — R32 Unspecified urinary incontinence: Secondary | ICD-10-CM | POA: Diagnosis present

## 2015-03-14 DIAGNOSIS — Z7982 Long term (current) use of aspirin: Secondary | ICD-10-CM | POA: Diagnosis not present

## 2015-03-14 DIAGNOSIS — Z79899 Other long term (current) drug therapy: Secondary | ICD-10-CM | POA: Diagnosis not present

## 2015-03-14 DIAGNOSIS — I6350 Cerebral infarction due to unspecified occlusion or stenosis of unspecified cerebral artery: Secondary | ICD-10-CM | POA: Diagnosis not present

## 2015-03-14 DIAGNOSIS — Z951 Presence of aortocoronary bypass graft: Secondary | ICD-10-CM | POA: Diagnosis not present

## 2015-03-14 DIAGNOSIS — I10 Essential (primary) hypertension: Secondary | ICD-10-CM | POA: Diagnosis present

## 2015-03-14 DIAGNOSIS — Z9889 Other specified postprocedural states: Secondary | ICD-10-CM | POA: Diagnosis not present

## 2015-03-14 DIAGNOSIS — Z8673 Personal history of transient ischemic attack (TIA), and cerebral infarction without residual deficits: Secondary | ICD-10-CM | POA: Diagnosis not present

## 2015-03-14 DIAGNOSIS — I4891 Unspecified atrial fibrillation: Secondary | ICD-10-CM | POA: Diagnosis present

## 2015-03-14 DIAGNOSIS — M069 Rheumatoid arthritis, unspecified: Secondary | ICD-10-CM | POA: Diagnosis present

## 2015-03-14 DIAGNOSIS — R05 Cough: Secondary | ICD-10-CM | POA: Diagnosis not present

## 2015-03-14 DIAGNOSIS — E785 Hyperlipidemia, unspecified: Secondary | ICD-10-CM | POA: Diagnosis present

## 2015-03-14 DIAGNOSIS — D649 Anemia, unspecified: Secondary | ICD-10-CM | POA: Diagnosis present

## 2015-03-14 DIAGNOSIS — Z8249 Family history of ischemic heart disease and other diseases of the circulatory system: Secondary | ICD-10-CM | POA: Diagnosis not present

## 2015-03-14 DIAGNOSIS — T8131XA Disruption of external operation (surgical) wound, not elsewhere classified, initial encounter: Secondary | ICD-10-CM | POA: Diagnosis not present

## 2015-03-14 DIAGNOSIS — R918 Other nonspecific abnormal finding of lung field: Secondary | ICD-10-CM | POA: Diagnosis not present

## 2015-03-14 DIAGNOSIS — R768 Other specified abnormal immunological findings in serum: Secondary | ICD-10-CM | POA: Diagnosis not present

## 2015-03-14 DIAGNOSIS — T814XXA Infection following a procedure, initial encounter: Secondary | ICD-10-CM | POA: Diagnosis present

## 2015-03-14 DIAGNOSIS — I2510 Atherosclerotic heart disease of native coronary artery without angina pectoris: Secondary | ICD-10-CM | POA: Diagnosis present

## 2015-03-21 ENCOUNTER — Other Ambulatory Visit
Admission: RE | Admit: 2015-03-21 | Discharge: 2015-03-21 | Disposition: A | Discharge status: Home / Self Care | Payer: Medicare Other | Source: Ambulatory Visit | Attending: Family Medicine | Admitting: Family Medicine

## 2015-03-21 DIAGNOSIS — I10 Essential (primary) hypertension: Secondary | ICD-10-CM | POA: Diagnosis not present

## 2015-03-21 LAB — CBC WITH DIFFERENTIAL/PLATELET
BASOS ABS: 0.1 10*3/uL (ref 0–0.1)
EOS ABS: 0.4 10*3/uL (ref 0–0.7)
Eosinophils Relative: 3 %
HEMATOCRIT: 32 % — AB (ref 35.0–47.0)
HEMOGLOBIN: 9.9 g/dL — AB (ref 12.0–16.0)
Lymphocytes Relative: 13 %
Lymphs Abs: 1.9 10*3/uL (ref 1.0–3.6)
MCH: 25.3 pg — ABNORMAL LOW (ref 26.0–34.0)
MCHC: 30.8 g/dL — AB (ref 32.0–36.0)
MCV: 82.2 fL (ref 80.0–100.0)
Monocytes Absolute: 1.2 10*3/uL — ABNORMAL HIGH (ref 0.2–0.9)
NEUTROS ABS: 11.3 10*3/uL — AB (ref 1.4–6.5)
Platelets: ADEQUATE 10*3/uL (ref 150–440)
RBC: 3.9 MIL/uL (ref 3.80–5.20)
RDW: 17.9 % — ABNORMAL HIGH (ref 11.5–14.5)
WBC: 14.8 10*3/uL — AB (ref 3.6–11.0)

## 2015-03-25 DIAGNOSIS — I119 Hypertensive heart disease without heart failure: Secondary | ICD-10-CM | POA: Diagnosis not present

## 2015-03-25 LAB — BASIC METABOLIC PANEL
Anion gap: 8 (ref 5–15)
BUN: 16 mg/dL (ref 6–20)
CALCIUM: 8.4 mg/dL — AB (ref 8.9–10.3)
CO2: 28 mmol/L (ref 22–32)
CREATININE: 0.61 mg/dL (ref 0.44–1.00)
Chloride: 103 mmol/L (ref 101–111)
GFR calc Af Amer: >60 mL/min (ref >60)
GLUCOSE: 92 mg/dL (ref 65–99)
Potassium: 4.9 mmol/L (ref 3.5–5.1)
Sodium: 139 mmol/L (ref 135–145)

## 2015-03-27 DIAGNOSIS — T814XXS Infection following a procedure, sequela: Secondary | ICD-10-CM | POA: Diagnosis not present

## 2015-03-29 DIAGNOSIS — I4891 Unspecified atrial fibrillation: Secondary | ICD-10-CM | POA: Diagnosis not present

## 2015-03-29 DIAGNOSIS — M069 Rheumatoid arthritis, unspecified: Secondary | ICD-10-CM | POA: Diagnosis not present

## 2015-03-29 DIAGNOSIS — D649 Anemia, unspecified: Secondary | ICD-10-CM | POA: Diagnosis not present

## 2015-03-29 DIAGNOSIS — I10 Essential (primary) hypertension: Secondary | ICD-10-CM | POA: Diagnosis not present

## 2015-03-29 DIAGNOSIS — I2510 Atherosclerotic heart disease of native coronary artery without angina pectoris: Secondary | ICD-10-CM | POA: Diagnosis not present

## 2015-03-29 DIAGNOSIS — I6350 Cerebral infarction due to unspecified occlusion or stenosis of unspecified cerebral artery: Secondary | ICD-10-CM | POA: Diagnosis not present

## 2015-03-29 DIAGNOSIS — E784 Other hyperlipidemia: Secondary | ICD-10-CM | POA: Diagnosis not present

## 2015-03-29 DIAGNOSIS — M25559 Pain in unspecified hip: Secondary | ICD-10-CM | POA: Diagnosis not present

## 2015-04-01 DIAGNOSIS — D500 Iron deficiency anemia secondary to blood loss (chronic): Secondary | ICD-10-CM | POA: Diagnosis not present

## 2015-04-04 DIAGNOSIS — Z09 Encounter for follow-up examination after completed treatment for conditions other than malignant neoplasm: Secondary | ICD-10-CM | POA: Diagnosis not present

## 2015-04-04 DIAGNOSIS — Z9889 Other specified postprocedural states: Secondary | ICD-10-CM | POA: Diagnosis not present

## 2015-04-04 DIAGNOSIS — R918 Other nonspecific abnormal finding of lung field: Secondary | ICD-10-CM | POA: Diagnosis not present

## 2015-04-04 DIAGNOSIS — R9431 Abnormal electrocardiogram [ECG] [EKG]: Secondary | ICD-10-CM | POA: Diagnosis not present

## 2015-04-04 DIAGNOSIS — I4510 Unspecified right bundle-branch block: Secondary | ICD-10-CM | POA: Diagnosis not present

## 2015-04-04 DIAGNOSIS — M21921 Unspecified acquired deformity of right upper arm: Secondary | ICD-10-CM | POA: Diagnosis not present

## 2015-04-18 DIAGNOSIS — E784 Other hyperlipidemia: Secondary | ICD-10-CM | POA: Diagnosis not present

## 2015-04-18 DIAGNOSIS — Z9889 Other specified postprocedural states: Secondary | ICD-10-CM | POA: Diagnosis not present

## 2015-04-18 DIAGNOSIS — M5134 Other intervertebral disc degeneration, thoracic region: Secondary | ICD-10-CM | POA: Diagnosis not present

## 2015-04-18 DIAGNOSIS — I10 Essential (primary) hypertension: Secondary | ICD-10-CM | POA: Diagnosis not present

## 2015-04-18 DIAGNOSIS — F329 Major depressive disorder, single episode, unspecified: Secondary | ICD-10-CM | POA: Diagnosis not present

## 2015-04-18 DIAGNOSIS — D649 Anemia, unspecified: Secondary | ICD-10-CM | POA: Diagnosis not present

## 2015-04-18 DIAGNOSIS — I6350 Cerebral infarction due to unspecified occlusion or stenosis of unspecified cerebral artery: Secondary | ICD-10-CM | POA: Diagnosis not present

## 2015-04-18 DIAGNOSIS — I4891 Unspecified atrial fibrillation: Secondary | ICD-10-CM | POA: Diagnosis not present

## 2015-04-18 DIAGNOSIS — T814XXD Infection following a procedure, subsequent encounter: Secondary | ICD-10-CM | POA: Diagnosis not present

## 2015-04-18 DIAGNOSIS — Z951 Presence of aortocoronary bypass graft: Secondary | ICD-10-CM | POA: Diagnosis not present

## 2015-04-18 DIAGNOSIS — Z5181 Encounter for therapeutic drug level monitoring: Secondary | ICD-10-CM | POA: Diagnosis not present

## 2015-04-18 DIAGNOSIS — Z01818 Encounter for other preprocedural examination: Secondary | ICD-10-CM | POA: Diagnosis not present

## 2015-04-18 DIAGNOSIS — R918 Other nonspecific abnormal finding of lung field: Secondary | ICD-10-CM | POA: Diagnosis not present

## 2015-04-18 DIAGNOSIS — Z952 Presence of prosthetic heart valve: Secondary | ICD-10-CM | POA: Diagnosis not present

## 2015-04-18 DIAGNOSIS — Z79899 Other long term (current) drug therapy: Secondary | ICD-10-CM | POA: Diagnosis not present

## 2015-04-18 DIAGNOSIS — I517 Cardiomegaly: Secondary | ICD-10-CM | POA: Diagnosis not present

## 2015-04-18 DIAGNOSIS — F419 Anxiety disorder, unspecified: Secondary | ICD-10-CM | POA: Diagnosis not present

## 2015-04-18 DIAGNOSIS — I2510 Atherosclerotic heart disease of native coronary artery without angina pectoris: Secondary | ICD-10-CM | POA: Diagnosis not present

## 2015-04-19 DIAGNOSIS — D649 Anemia, unspecified: Secondary | ICD-10-CM | POA: Diagnosis not present

## 2015-04-19 DIAGNOSIS — I10 Essential (primary) hypertension: Secondary | ICD-10-CM | POA: Diagnosis not present

## 2015-04-19 DIAGNOSIS — E559 Vitamin D deficiency, unspecified: Secondary | ICD-10-CM | POA: Diagnosis not present

## 2015-04-23 DIAGNOSIS — I119 Hypertensive heart disease without heart failure: Secondary | ICD-10-CM | POA: Diagnosis present

## 2015-04-23 DIAGNOSIS — I4891 Unspecified atrial fibrillation: Secondary | ICD-10-CM | POA: Diagnosis present

## 2015-04-23 DIAGNOSIS — I480 Paroxysmal atrial fibrillation: Secondary | ICD-10-CM | POA: Diagnosis not present

## 2015-04-23 DIAGNOSIS — D62 Acute posthemorrhagic anemia: Secondary | ICD-10-CM | POA: Diagnosis not present

## 2015-04-23 DIAGNOSIS — I350 Nonrheumatic aortic (valve) stenosis: Secondary | ICD-10-CM | POA: Diagnosis present

## 2015-04-23 DIAGNOSIS — W19XXXA Unspecified fall, initial encounter: Secondary | ICD-10-CM | POA: Diagnosis not present

## 2015-04-23 DIAGNOSIS — S21109A Unspecified open wound of unspecified front wall of thorax without penetration into thoracic cavity, initial encounter: Secondary | ICD-10-CM | POA: Diagnosis present

## 2015-04-23 DIAGNOSIS — I340 Nonrheumatic mitral (valve) insufficiency: Secondary | ICD-10-CM | POA: Diagnosis present

## 2015-04-23 DIAGNOSIS — F05 Delirium due to known physiological condition: Secondary | ICD-10-CM | POA: Diagnosis not present

## 2015-04-23 DIAGNOSIS — M810 Age-related osteoporosis without current pathological fracture: Secondary | ICD-10-CM | POA: Diagnosis present

## 2015-04-23 DIAGNOSIS — Z481 Encounter for planned postprocedural wound closure: Secondary | ICD-10-CM | POA: Diagnosis not present

## 2015-04-23 DIAGNOSIS — I252 Old myocardial infarction: Secondary | ICD-10-CM | POA: Diagnosis not present

## 2015-04-23 DIAGNOSIS — Z452 Encounter for adjustment and management of vascular access device: Secondary | ICD-10-CM | POA: Diagnosis not present

## 2015-04-23 DIAGNOSIS — Z953 Presence of xenogenic heart valve: Secondary | ICD-10-CM | POA: Diagnosis not present

## 2015-04-23 DIAGNOSIS — F324 Major depressive disorder, single episode, in partial remission: Secondary | ICD-10-CM | POA: Diagnosis not present

## 2015-04-23 DIAGNOSIS — T814XXA Infection following a procedure, initial encounter: Secondary | ICD-10-CM | POA: Diagnosis present

## 2015-04-23 DIAGNOSIS — B3789 Other sites of candidiasis: Secondary | ICD-10-CM | POA: Diagnosis present

## 2015-04-23 DIAGNOSIS — J811 Chronic pulmonary edema: Secondary | ICD-10-CM | POA: Diagnosis present

## 2015-04-23 DIAGNOSIS — R220 Localized swelling, mass and lump, head: Secondary | ICD-10-CM | POA: Diagnosis not present

## 2015-04-23 DIAGNOSIS — M069 Rheumatoid arthritis, unspecified: Secondary | ICD-10-CM | POA: Diagnosis present

## 2015-04-23 DIAGNOSIS — A363 Cutaneous diphtheria: Secondary | ICD-10-CM | POA: Diagnosis not present

## 2015-04-23 DIAGNOSIS — Z9181 History of falling: Secondary | ICD-10-CM | POA: Diagnosis not present

## 2015-04-23 DIAGNOSIS — M6250 Muscle wasting and atrophy, not elsewhere classified, unspecified site: Secondary | ICD-10-CM | POA: Diagnosis not present

## 2015-04-23 DIAGNOSIS — B9689 Other specified bacterial agents as the cause of diseases classified elsewhere: Secondary | ICD-10-CM | POA: Diagnosis present

## 2015-04-23 DIAGNOSIS — E782 Mixed hyperlipidemia: Secondary | ICD-10-CM | POA: Diagnosis present

## 2015-04-23 DIAGNOSIS — G8918 Other acute postprocedural pain: Secondary | ICD-10-CM | POA: Diagnosis not present

## 2015-04-23 DIAGNOSIS — L089 Local infection of the skin and subcutaneous tissue, unspecified: Secondary | ICD-10-CM | POA: Diagnosis present

## 2015-04-23 DIAGNOSIS — R410 Disorientation, unspecified: Secondary | ICD-10-CM | POA: Diagnosis not present

## 2015-04-23 DIAGNOSIS — L7634 Postprocedural seroma of skin and subcutaneous tissue following other procedure: Secondary | ICD-10-CM | POA: Diagnosis present

## 2015-04-23 DIAGNOSIS — E871 Hypo-osmolality and hyponatremia: Secondary | ICD-10-CM | POA: Diagnosis not present

## 2015-04-23 DIAGNOSIS — I25119 Atherosclerotic heart disease of native coronary artery with unspecified angina pectoris: Secondary | ICD-10-CM | POA: Diagnosis present

## 2015-04-23 DIAGNOSIS — I6300 Cerebral infarction due to thrombosis of unspecified precerebral artery: Secondary | ICD-10-CM | POA: Diagnosis not present

## 2015-04-23 DIAGNOSIS — T8189XA Other complications of procedures, not elsewhere classified, initial encounter: Secondary | ICD-10-CM | POA: Diagnosis not present

## 2015-04-23 DIAGNOSIS — I6529 Occlusion and stenosis of unspecified carotid artery: Secondary | ICD-10-CM | POA: Diagnosis present

## 2015-04-23 DIAGNOSIS — R262 Difficulty in walking, not elsewhere classified: Secondary | ICD-10-CM | POA: Diagnosis not present

## 2015-04-23 DIAGNOSIS — R918 Other nonspecific abnormal finding of lung field: Secondary | ICD-10-CM | POA: Diagnosis not present

## 2015-04-23 DIAGNOSIS — Z952 Presence of prosthetic heart valve: Secondary | ICD-10-CM | POA: Diagnosis not present

## 2015-04-23 DIAGNOSIS — Z951 Presence of aortocoronary bypass graft: Secondary | ICD-10-CM | POA: Diagnosis not present

## 2015-04-23 DIAGNOSIS — Z8673 Personal history of transient ischemic attack (TIA), and cerebral infarction without residual deficits: Secondary | ICD-10-CM | POA: Diagnosis not present

## 2015-04-23 DIAGNOSIS — T8131XA Disruption of external operation (surgical) wound, not elsewhere classified, initial encounter: Secondary | ICD-10-CM | POA: Diagnosis present

## 2015-04-23 DIAGNOSIS — I517 Cardiomegaly: Secondary | ICD-10-CM | POA: Diagnosis not present

## 2015-04-23 DIAGNOSIS — R32 Unspecified urinary incontinence: Secondary | ICD-10-CM | POA: Diagnosis not present

## 2015-04-23 DIAGNOSIS — R5381 Other malaise: Secondary | ICD-10-CM | POA: Diagnosis not present

## 2015-05-10 DIAGNOSIS — T814XXD Infection following a procedure, subsequent encounter: Secondary | ICD-10-CM | POA: Diagnosis not present

## 2015-05-12 DIAGNOSIS — D649 Anemia, unspecified: Secondary | ICD-10-CM | POA: Diagnosis not present

## 2015-05-12 DIAGNOSIS — I10 Essential (primary) hypertension: Secondary | ICD-10-CM | POA: Diagnosis not present

## 2015-05-12 DIAGNOSIS — F419 Anxiety disorder, unspecified: Secondary | ICD-10-CM | POA: Diagnosis not present

## 2015-05-12 DIAGNOSIS — I2510 Atherosclerotic heart disease of native coronary artery without angina pectoris: Secondary | ICD-10-CM | POA: Diagnosis not present

## 2015-05-12 DIAGNOSIS — E784 Other hyperlipidemia: Secondary | ICD-10-CM | POA: Diagnosis not present

## 2015-05-12 DIAGNOSIS — I6350 Cerebral infarction due to unspecified occlusion or stenosis of unspecified cerebral artery: Secondary | ICD-10-CM | POA: Diagnosis not present

## 2015-05-12 DIAGNOSIS — I4891 Unspecified atrial fibrillation: Secondary | ICD-10-CM | POA: Diagnosis not present

## 2015-05-12 DIAGNOSIS — F329 Major depressive disorder, single episode, unspecified: Secondary | ICD-10-CM | POA: Diagnosis not present

## 2015-05-13 DIAGNOSIS — T814XXD Infection following a procedure, subsequent encounter: Secondary | ICD-10-CM | POA: Diagnosis not present

## 2015-05-14 DIAGNOSIS — Z5189 Encounter for other specified aftercare: Secondary | ICD-10-CM | POA: Diagnosis not present

## 2015-05-14 DIAGNOSIS — N390 Urinary tract infection, site not specified: Secondary | ICD-10-CM | POA: Diagnosis not present

## 2015-05-16 DIAGNOSIS — Z79899 Other long term (current) drug therapy: Secondary | ICD-10-CM | POA: Diagnosis not present

## 2015-05-16 DIAGNOSIS — Z7901 Long term (current) use of anticoagulants: Secondary | ICD-10-CM | POA: Diagnosis not present

## 2015-05-16 DIAGNOSIS — R079 Chest pain, unspecified: Secondary | ICD-10-CM | POA: Diagnosis not present

## 2015-05-16 DIAGNOSIS — T814XXD Infection following a procedure, subsequent encounter: Secondary | ICD-10-CM | POA: Diagnosis not present

## 2015-05-16 DIAGNOSIS — I480 Paroxysmal atrial fibrillation: Secondary | ICD-10-CM | POA: Diagnosis not present

## 2015-05-20 DIAGNOSIS — I4891 Unspecified atrial fibrillation: Secondary | ICD-10-CM | POA: Diagnosis not present

## 2015-05-20 DIAGNOSIS — E784 Other hyperlipidemia: Secondary | ICD-10-CM | POA: Diagnosis not present

## 2015-05-20 DIAGNOSIS — K5900 Constipation, unspecified: Secondary | ICD-10-CM | POA: Diagnosis not present

## 2015-05-20 DIAGNOSIS — T814XXA Infection following a procedure, initial encounter: Secondary | ICD-10-CM | POA: Diagnosis not present

## 2015-05-20 DIAGNOSIS — I2510 Atherosclerotic heart disease of native coronary artery without angina pectoris: Secondary | ICD-10-CM | POA: Diagnosis not present

## 2015-05-20 DIAGNOSIS — I6350 Cerebral infarction due to unspecified occlusion or stenosis of unspecified cerebral artery: Secondary | ICD-10-CM | POA: Diagnosis not present

## 2015-05-20 DIAGNOSIS — I10 Essential (primary) hypertension: Secondary | ICD-10-CM | POA: Diagnosis not present

## 2015-05-20 DIAGNOSIS — D649 Anemia, unspecified: Secondary | ICD-10-CM | POA: Diagnosis not present

## 2015-05-22 DIAGNOSIS — I2510 Atherosclerotic heart disease of native coronary artery without angina pectoris: Secondary | ICD-10-CM | POA: Diagnosis not present

## 2015-05-22 DIAGNOSIS — M6281 Muscle weakness (generalized): Secondary | ICD-10-CM | POA: Diagnosis not present

## 2015-05-22 DIAGNOSIS — R262 Difficulty in walking, not elsewhere classified: Secondary | ICD-10-CM | POA: Diagnosis not present

## 2015-05-22 DIAGNOSIS — I214 Non-ST elevation (NSTEMI) myocardial infarction: Secondary | ICD-10-CM | POA: Diagnosis not present

## 2015-05-22 DIAGNOSIS — I350 Nonrheumatic aortic (valve) stenosis: Secondary | ICD-10-CM | POA: Diagnosis not present

## 2015-05-22 DIAGNOSIS — I639 Cerebral infarction, unspecified: Secondary | ICD-10-CM | POA: Diagnosis not present

## 2015-05-22 DIAGNOSIS — I4891 Unspecified atrial fibrillation: Secondary | ICD-10-CM | POA: Diagnosis not present

## 2015-05-22 DIAGNOSIS — I517 Cardiomegaly: Secondary | ICD-10-CM | POA: Diagnosis not present

## 2015-05-22 DIAGNOSIS — R531 Weakness: Secondary | ICD-10-CM | POA: Diagnosis not present

## 2015-05-23 DIAGNOSIS — I517 Cardiomegaly: Secondary | ICD-10-CM | POA: Diagnosis not present

## 2015-05-23 DIAGNOSIS — I639 Cerebral infarction, unspecified: Secondary | ICD-10-CM | POA: Diagnosis not present

## 2015-05-23 DIAGNOSIS — I214 Non-ST elevation (NSTEMI) myocardial infarction: Secondary | ICD-10-CM | POA: Diagnosis not present

## 2015-05-23 DIAGNOSIS — R262 Difficulty in walking, not elsewhere classified: Secondary | ICD-10-CM | POA: Diagnosis not present

## 2015-05-23 DIAGNOSIS — M6281 Muscle weakness (generalized): Secondary | ICD-10-CM | POA: Diagnosis not present

## 2015-05-23 DIAGNOSIS — I2510 Atherosclerotic heart disease of native coronary artery without angina pectoris: Secondary | ICD-10-CM | POA: Diagnosis not present

## 2015-05-24 DIAGNOSIS — I214 Non-ST elevation (NSTEMI) myocardial infarction: Secondary | ICD-10-CM | POA: Diagnosis not present

## 2015-05-24 DIAGNOSIS — I2510 Atherosclerotic heart disease of native coronary artery without angina pectoris: Secondary | ICD-10-CM | POA: Diagnosis not present

## 2015-05-24 DIAGNOSIS — I517 Cardiomegaly: Secondary | ICD-10-CM | POA: Diagnosis not present

## 2015-05-24 DIAGNOSIS — I639 Cerebral infarction, unspecified: Secondary | ICD-10-CM | POA: Diagnosis not present

## 2015-05-24 DIAGNOSIS — M6281 Muscle weakness (generalized): Secondary | ICD-10-CM | POA: Diagnosis not present

## 2015-05-24 DIAGNOSIS — R262 Difficulty in walking, not elsewhere classified: Secondary | ICD-10-CM | POA: Diagnosis not present

## 2015-05-27 DIAGNOSIS — I4891 Unspecified atrial fibrillation: Secondary | ICD-10-CM | POA: Diagnosis not present

## 2015-05-27 DIAGNOSIS — I2510 Atherosclerotic heart disease of native coronary artery without angina pectoris: Secondary | ICD-10-CM | POA: Diagnosis not present

## 2015-05-27 DIAGNOSIS — M6281 Muscle weakness (generalized): Secondary | ICD-10-CM | POA: Diagnosis not present

## 2015-05-27 DIAGNOSIS — I639 Cerebral infarction, unspecified: Secondary | ICD-10-CM | POA: Diagnosis not present

## 2015-05-27 DIAGNOSIS — I517 Cardiomegaly: Secondary | ICD-10-CM | POA: Diagnosis not present

## 2015-05-27 DIAGNOSIS — I350 Nonrheumatic aortic (valve) stenosis: Secondary | ICD-10-CM | POA: Diagnosis not present

## 2015-05-27 DIAGNOSIS — I214 Non-ST elevation (NSTEMI) myocardial infarction: Secondary | ICD-10-CM | POA: Diagnosis not present

## 2015-05-27 DIAGNOSIS — R262 Difficulty in walking, not elsewhere classified: Secondary | ICD-10-CM | POA: Diagnosis not present

## 2015-06-01 DIAGNOSIS — D519 Vitamin B12 deficiency anemia, unspecified: Secondary | ICD-10-CM | POA: Diagnosis not present

## 2015-06-01 DIAGNOSIS — I4891 Unspecified atrial fibrillation: Secondary | ICD-10-CM | POA: Diagnosis not present

## 2015-06-01 DIAGNOSIS — I2510 Atherosclerotic heart disease of native coronary artery without angina pectoris: Secondary | ICD-10-CM | POA: Diagnosis not present

## 2015-06-01 DIAGNOSIS — I6350 Cerebral infarction due to unspecified occlusion or stenosis of unspecified cerebral artery: Secondary | ICD-10-CM | POA: Diagnosis not present

## 2015-06-01 DIAGNOSIS — F329 Major depressive disorder, single episode, unspecified: Secondary | ICD-10-CM | POA: Diagnosis not present

## 2015-06-01 DIAGNOSIS — E784 Other hyperlipidemia: Secondary | ICD-10-CM | POA: Diagnosis not present

## 2015-06-01 DIAGNOSIS — T814XXA Infection following a procedure, initial encounter: Secondary | ICD-10-CM | POA: Diagnosis not present

## 2015-06-01 DIAGNOSIS — F419 Anxiety disorder, unspecified: Secondary | ICD-10-CM | POA: Diagnosis not present

## 2015-06-04 DIAGNOSIS — I639 Cerebral infarction, unspecified: Secondary | ICD-10-CM | POA: Diagnosis not present

## 2015-06-04 DIAGNOSIS — R531 Weakness: Secondary | ICD-10-CM | POA: Diagnosis not present

## 2015-06-04 DIAGNOSIS — E559 Vitamin D deficiency, unspecified: Secondary | ICD-10-CM | POA: Diagnosis not present

## 2015-06-10 DIAGNOSIS — D649 Anemia, unspecified: Secondary | ICD-10-CM | POA: Diagnosis not present

## 2015-06-10 DIAGNOSIS — R531 Weakness: Secondary | ICD-10-CM | POA: Diagnosis not present

## 2015-06-14 DIAGNOSIS — M25559 Pain in unspecified hip: Secondary | ICD-10-CM | POA: Diagnosis not present

## 2015-06-14 DIAGNOSIS — M810 Age-related osteoporosis without current pathological fracture: Secondary | ICD-10-CM | POA: Diagnosis not present

## 2015-06-14 DIAGNOSIS — I2510 Atherosclerotic heart disease of native coronary artery without angina pectoris: Secondary | ICD-10-CM | POA: Diagnosis not present

## 2015-06-14 DIAGNOSIS — T814XXD Infection following a procedure, subsequent encounter: Secondary | ICD-10-CM | POA: Diagnosis not present

## 2015-06-14 DIAGNOSIS — I10 Essential (primary) hypertension: Secondary | ICD-10-CM | POA: Diagnosis not present

## 2015-06-14 DIAGNOSIS — I4891 Unspecified atrial fibrillation: Secondary | ICD-10-CM | POA: Diagnosis not present

## 2015-06-14 DIAGNOSIS — E784 Other hyperlipidemia: Secondary | ICD-10-CM | POA: Diagnosis not present

## 2015-06-14 DIAGNOSIS — I6350 Cerebral infarction due to unspecified occlusion or stenosis of unspecified cerebral artery: Secondary | ICD-10-CM | POA: Diagnosis not present

## 2015-06-18 DIAGNOSIS — M6281 Muscle weakness (generalized): Secondary | ICD-10-CM | POA: Diagnosis not present

## 2015-06-25 DIAGNOSIS — M810 Age-related osteoporosis without current pathological fracture: Secondary | ICD-10-CM | POA: Diagnosis not present

## 2015-06-25 DIAGNOSIS — M0579 Rheumatoid arthritis with rheumatoid factor of multiple sites without organ or systems involvement: Secondary | ICD-10-CM | POA: Diagnosis not present

## 2015-06-25 DIAGNOSIS — G8929 Other chronic pain: Secondary | ICD-10-CM | POA: Diagnosis not present

## 2015-06-25 DIAGNOSIS — M25551 Pain in right hip: Secondary | ICD-10-CM | POA: Diagnosis not present

## 2015-06-25 DIAGNOSIS — I2510 Atherosclerotic heart disease of native coronary artery without angina pectoris: Secondary | ICD-10-CM | POA: Diagnosis not present

## 2015-06-25 DIAGNOSIS — M79651 Pain in right thigh: Secondary | ICD-10-CM | POA: Diagnosis not present

## 2015-07-02 DIAGNOSIS — I4891 Unspecified atrial fibrillation: Secondary | ICD-10-CM | POA: Diagnosis not present

## 2015-07-02 DIAGNOSIS — F329 Major depressive disorder, single episode, unspecified: Secondary | ICD-10-CM | POA: Diagnosis not present

## 2015-07-02 DIAGNOSIS — I6350 Cerebral infarction due to unspecified occlusion or stenosis of unspecified cerebral artery: Secondary | ICD-10-CM | POA: Diagnosis not present

## 2015-07-02 DIAGNOSIS — E784 Other hyperlipidemia: Secondary | ICD-10-CM | POA: Diagnosis not present

## 2015-07-02 DIAGNOSIS — I25810 Atherosclerosis of coronary artery bypass graft(s) without angina pectoris: Secondary | ICD-10-CM | POA: Diagnosis not present

## 2015-07-02 DIAGNOSIS — I10 Essential (primary) hypertension: Secondary | ICD-10-CM | POA: Diagnosis not present

## 2015-07-02 DIAGNOSIS — D519 Vitamin B12 deficiency anemia, unspecified: Secondary | ICD-10-CM | POA: Diagnosis not present

## 2015-07-02 DIAGNOSIS — I350 Nonrheumatic aortic (valve) stenosis: Secondary | ICD-10-CM | POA: Diagnosis not present

## 2015-07-02 DIAGNOSIS — I442 Atrioventricular block, complete: Secondary | ICD-10-CM | POA: Diagnosis not present

## 2015-07-02 DIAGNOSIS — F419 Anxiety disorder, unspecified: Secondary | ICD-10-CM | POA: Diagnosis not present

## 2015-07-02 DIAGNOSIS — R0602 Shortness of breath: Secondary | ICD-10-CM | POA: Diagnosis not present

## 2015-07-02 DIAGNOSIS — Z9889 Other specified postprocedural states: Secondary | ICD-10-CM | POA: Diagnosis not present

## 2015-07-02 DIAGNOSIS — K5900 Constipation, unspecified: Secondary | ICD-10-CM | POA: Diagnosis not present

## 2015-07-02 DIAGNOSIS — I2510 Atherosclerotic heart disease of native coronary artery without angina pectoris: Secondary | ICD-10-CM | POA: Diagnosis not present

## 2015-07-06 DIAGNOSIS — I509 Heart failure, unspecified: Secondary | ICD-10-CM | POA: Diagnosis not present

## 2015-07-06 DIAGNOSIS — E785 Hyperlipidemia, unspecified: Secondary | ICD-10-CM | POA: Diagnosis not present

## 2015-07-06 DIAGNOSIS — M6281 Muscle weakness (generalized): Secondary | ICD-10-CM | POA: Diagnosis not present

## 2015-07-10 DIAGNOSIS — I2510 Atherosclerotic heart disease of native coronary artery without angina pectoris: Secondary | ICD-10-CM | POA: Diagnosis not present

## 2015-07-10 DIAGNOSIS — M79672 Pain in left foot: Secondary | ICD-10-CM | POA: Diagnosis not present

## 2015-07-10 DIAGNOSIS — M79671 Pain in right foot: Secondary | ICD-10-CM | POA: Diagnosis not present

## 2015-07-10 DIAGNOSIS — B351 Tinea unguium: Secondary | ICD-10-CM | POA: Diagnosis not present

## 2015-07-16 DIAGNOSIS — I480 Paroxysmal atrial fibrillation: Secondary | ICD-10-CM | POA: Diagnosis not present

## 2015-07-16 DIAGNOSIS — I25810 Atherosclerosis of coronary artery bypass graft(s) without angina pectoris: Secondary | ICD-10-CM | POA: Diagnosis not present

## 2015-07-16 DIAGNOSIS — I6300 Cerebral infarction due to thrombosis of unspecified precerebral artery: Secondary | ICD-10-CM | POA: Diagnosis not present

## 2015-07-16 DIAGNOSIS — I340 Nonrheumatic mitral (valve) insufficiency: Secondary | ICD-10-CM | POA: Diagnosis not present

## 2015-07-16 DIAGNOSIS — R0602 Shortness of breath: Secondary | ICD-10-CM | POA: Diagnosis not present

## 2015-07-18 DIAGNOSIS — I10 Essential (primary) hypertension: Secondary | ICD-10-CM | POA: Diagnosis not present

## 2015-07-18 DIAGNOSIS — M810 Age-related osteoporosis without current pathological fracture: Secondary | ICD-10-CM | POA: Diagnosis not present

## 2015-07-18 DIAGNOSIS — I2510 Atherosclerotic heart disease of native coronary artery without angina pectoris: Secondary | ICD-10-CM | POA: Diagnosis not present

## 2015-07-18 DIAGNOSIS — T814XXD Infection following a procedure, subsequent encounter: Secondary | ICD-10-CM | POA: Diagnosis not present

## 2015-07-18 DIAGNOSIS — I6350 Cerebral infarction due to unspecified occlusion or stenosis of unspecified cerebral artery: Secondary | ICD-10-CM | POA: Diagnosis not present

## 2015-07-18 DIAGNOSIS — I4891 Unspecified atrial fibrillation: Secondary | ICD-10-CM | POA: Diagnosis not present

## 2015-07-18 DIAGNOSIS — F419 Anxiety disorder, unspecified: Secondary | ICD-10-CM | POA: Diagnosis not present

## 2015-07-18 DIAGNOSIS — M069 Rheumatoid arthritis, unspecified: Secondary | ICD-10-CM | POA: Diagnosis not present

## 2015-07-22 DIAGNOSIS — M0579 Rheumatoid arthritis with rheumatoid factor of multiple sites without organ or systems involvement: Secondary | ICD-10-CM | POA: Diagnosis not present

## 2015-07-22 DIAGNOSIS — G8929 Other chronic pain: Secondary | ICD-10-CM | POA: Diagnosis not present

## 2015-07-22 DIAGNOSIS — M25551 Pain in right hip: Secondary | ICD-10-CM | POA: Diagnosis not present

## 2015-07-22 DIAGNOSIS — I2510 Atherosclerotic heart disease of native coronary artery without angina pectoris: Secondary | ICD-10-CM | POA: Diagnosis not present

## 2015-07-22 DIAGNOSIS — M810 Age-related osteoporosis without current pathological fracture: Secondary | ICD-10-CM | POA: Diagnosis not present

## 2015-07-23 DIAGNOSIS — G4700 Insomnia, unspecified: Secondary | ICD-10-CM | POA: Diagnosis not present

## 2015-07-23 DIAGNOSIS — M069 Rheumatoid arthritis, unspecified: Secondary | ICD-10-CM | POA: Diagnosis not present

## 2015-07-23 DIAGNOSIS — F419 Anxiety disorder, unspecified: Secondary | ICD-10-CM | POA: Diagnosis not present

## 2015-07-23 DIAGNOSIS — I6930 Unspecified sequelae of cerebral infarction: Secondary | ICD-10-CM | POA: Diagnosis not present

## 2015-07-30 DIAGNOSIS — I6930 Unspecified sequelae of cerebral infarction: Secondary | ICD-10-CM | POA: Diagnosis not present

## 2015-07-30 DIAGNOSIS — F419 Anxiety disorder, unspecified: Secondary | ICD-10-CM | POA: Diagnosis not present

## 2015-07-30 DIAGNOSIS — G4700 Insomnia, unspecified: Secondary | ICD-10-CM | POA: Diagnosis not present

## 2015-07-30 DIAGNOSIS — M069 Rheumatoid arthritis, unspecified: Secondary | ICD-10-CM | POA: Diagnosis not present

## 2015-08-11 DIAGNOSIS — I2510 Atherosclerotic heart disease of native coronary artery without angina pectoris: Secondary | ICD-10-CM | POA: Diagnosis not present

## 2015-08-11 DIAGNOSIS — I6350 Cerebral infarction due to unspecified occlusion or stenosis of unspecified cerebral artery: Secondary | ICD-10-CM | POA: Diagnosis not present

## 2015-08-11 DIAGNOSIS — K5900 Constipation, unspecified: Secondary | ICD-10-CM | POA: Diagnosis not present

## 2015-08-11 DIAGNOSIS — F329 Major depressive disorder, single episode, unspecified: Secondary | ICD-10-CM | POA: Diagnosis not present

## 2015-08-11 DIAGNOSIS — E784 Other hyperlipidemia: Secondary | ICD-10-CM | POA: Diagnosis not present

## 2015-08-11 DIAGNOSIS — I4891 Unspecified atrial fibrillation: Secondary | ICD-10-CM | POA: Diagnosis not present

## 2015-08-11 DIAGNOSIS — F419 Anxiety disorder, unspecified: Secondary | ICD-10-CM | POA: Diagnosis not present

## 2015-08-11 DIAGNOSIS — D519 Vitamin B12 deficiency anemia, unspecified: Secondary | ICD-10-CM | POA: Diagnosis not present

## 2015-08-13 DIAGNOSIS — I509 Heart failure, unspecified: Secondary | ICD-10-CM | POA: Diagnosis not present

## 2015-08-14 DIAGNOSIS — I214 Non-ST elevation (NSTEMI) myocardial infarction: Secondary | ICD-10-CM | POA: Diagnosis not present

## 2015-08-14 DIAGNOSIS — I517 Cardiomegaly: Secondary | ICD-10-CM | POA: Diagnosis not present

## 2015-08-14 DIAGNOSIS — I350 Nonrheumatic aortic (valve) stenosis: Secondary | ICD-10-CM | POA: Diagnosis not present

## 2015-08-14 DIAGNOSIS — R262 Difficulty in walking, not elsewhere classified: Secondary | ICD-10-CM | POA: Diagnosis not present

## 2015-08-14 DIAGNOSIS — I639 Cerebral infarction, unspecified: Secondary | ICD-10-CM | POA: Diagnosis not present

## 2015-08-14 DIAGNOSIS — I4891 Unspecified atrial fibrillation: Secondary | ICD-10-CM | POA: Diagnosis not present

## 2015-08-14 DIAGNOSIS — M6281 Muscle weakness (generalized): Secondary | ICD-10-CM | POA: Diagnosis not present

## 2015-08-14 DIAGNOSIS — I2510 Atherosclerotic heart disease of native coronary artery without angina pectoris: Secondary | ICD-10-CM | POA: Diagnosis not present

## 2015-08-15 DIAGNOSIS — I639 Cerebral infarction, unspecified: Secondary | ICD-10-CM | POA: Diagnosis not present

## 2015-08-15 DIAGNOSIS — R262 Difficulty in walking, not elsewhere classified: Secondary | ICD-10-CM | POA: Diagnosis not present

## 2015-08-15 DIAGNOSIS — I2510 Atherosclerotic heart disease of native coronary artery without angina pectoris: Secondary | ICD-10-CM | POA: Diagnosis not present

## 2015-08-15 DIAGNOSIS — M6281 Muscle weakness (generalized): Secondary | ICD-10-CM | POA: Diagnosis not present

## 2015-08-15 DIAGNOSIS — I214 Non-ST elevation (NSTEMI) myocardial infarction: Secondary | ICD-10-CM | POA: Diagnosis not present

## 2015-08-15 DIAGNOSIS — I517 Cardiomegaly: Secondary | ICD-10-CM | POA: Diagnosis not present

## 2015-08-16 DIAGNOSIS — I517 Cardiomegaly: Secondary | ICD-10-CM | POA: Diagnosis not present

## 2015-08-16 DIAGNOSIS — I214 Non-ST elevation (NSTEMI) myocardial infarction: Secondary | ICD-10-CM | POA: Diagnosis not present

## 2015-08-16 DIAGNOSIS — I639 Cerebral infarction, unspecified: Secondary | ICD-10-CM | POA: Diagnosis not present

## 2015-08-16 DIAGNOSIS — M6281 Muscle weakness (generalized): Secondary | ICD-10-CM | POA: Diagnosis not present

## 2015-08-16 DIAGNOSIS — I2510 Atherosclerotic heart disease of native coronary artery without angina pectoris: Secondary | ICD-10-CM | POA: Diagnosis not present

## 2015-08-16 DIAGNOSIS — R262 Difficulty in walking, not elsewhere classified: Secondary | ICD-10-CM | POA: Diagnosis not present

## 2015-08-19 DIAGNOSIS — I2510 Atherosclerotic heart disease of native coronary artery without angina pectoris: Secondary | ICD-10-CM | POA: Diagnosis not present

## 2015-08-19 DIAGNOSIS — M6281 Muscle weakness (generalized): Secondary | ICD-10-CM | POA: Diagnosis not present

## 2015-08-19 DIAGNOSIS — I517 Cardiomegaly: Secondary | ICD-10-CM | POA: Diagnosis not present

## 2015-08-19 DIAGNOSIS — I639 Cerebral infarction, unspecified: Secondary | ICD-10-CM | POA: Diagnosis not present

## 2015-08-19 DIAGNOSIS — R262 Difficulty in walking, not elsewhere classified: Secondary | ICD-10-CM | POA: Diagnosis not present

## 2015-08-19 DIAGNOSIS — I214 Non-ST elevation (NSTEMI) myocardial infarction: Secondary | ICD-10-CM | POA: Diagnosis not present

## 2015-08-20 DIAGNOSIS — I517 Cardiomegaly: Secondary | ICD-10-CM | POA: Diagnosis not present

## 2015-08-20 DIAGNOSIS — I2510 Atherosclerotic heart disease of native coronary artery without angina pectoris: Secondary | ICD-10-CM | POA: Diagnosis not present

## 2015-08-20 DIAGNOSIS — M6281 Muscle weakness (generalized): Secondary | ICD-10-CM | POA: Diagnosis not present

## 2015-08-20 DIAGNOSIS — I639 Cerebral infarction, unspecified: Secondary | ICD-10-CM | POA: Diagnosis not present

## 2015-08-20 DIAGNOSIS — R262 Difficulty in walking, not elsewhere classified: Secondary | ICD-10-CM | POA: Diagnosis not present

## 2015-08-20 DIAGNOSIS — I214 Non-ST elevation (NSTEMI) myocardial infarction: Secondary | ICD-10-CM | POA: Diagnosis not present

## 2015-08-21 DIAGNOSIS — I639 Cerebral infarction, unspecified: Secondary | ICD-10-CM | POA: Diagnosis not present

## 2015-08-21 DIAGNOSIS — I2510 Atherosclerotic heart disease of native coronary artery without angina pectoris: Secondary | ICD-10-CM | POA: Diagnosis not present

## 2015-08-21 DIAGNOSIS — I214 Non-ST elevation (NSTEMI) myocardial infarction: Secondary | ICD-10-CM | POA: Diagnosis not present

## 2015-08-21 DIAGNOSIS — R262 Difficulty in walking, not elsewhere classified: Secondary | ICD-10-CM | POA: Diagnosis not present

## 2015-08-21 DIAGNOSIS — I517 Cardiomegaly: Secondary | ICD-10-CM | POA: Diagnosis not present

## 2015-08-21 DIAGNOSIS — M6281 Muscle weakness (generalized): Secondary | ICD-10-CM | POA: Diagnosis not present

## 2015-08-22 DIAGNOSIS — I517 Cardiomegaly: Secondary | ICD-10-CM | POA: Diagnosis not present

## 2015-08-22 DIAGNOSIS — H524 Presbyopia: Secondary | ICD-10-CM | POA: Diagnosis not present

## 2015-08-22 DIAGNOSIS — Z961 Presence of intraocular lens: Secondary | ICD-10-CM | POA: Diagnosis not present

## 2015-08-22 DIAGNOSIS — I639 Cerebral infarction, unspecified: Secondary | ICD-10-CM | POA: Diagnosis not present

## 2015-08-22 DIAGNOSIS — I2510 Atherosclerotic heart disease of native coronary artery without angina pectoris: Secondary | ICD-10-CM | POA: Diagnosis not present

## 2015-08-22 DIAGNOSIS — R262 Difficulty in walking, not elsewhere classified: Secondary | ICD-10-CM | POA: Diagnosis not present

## 2015-08-22 DIAGNOSIS — Z79899 Other long term (current) drug therapy: Secondary | ICD-10-CM | POA: Diagnosis not present

## 2015-08-22 DIAGNOSIS — I214 Non-ST elevation (NSTEMI) myocardial infarction: Secondary | ICD-10-CM | POA: Diagnosis not present

## 2015-08-22 DIAGNOSIS — M6281 Muscle weakness (generalized): Secondary | ICD-10-CM | POA: Diagnosis not present

## 2015-08-26 DIAGNOSIS — M0579 Rheumatoid arthritis with rheumatoid factor of multiple sites without organ or systems involvement: Secondary | ICD-10-CM | POA: Diagnosis not present

## 2015-08-26 DIAGNOSIS — M810 Age-related osteoporosis without current pathological fracture: Secondary | ICD-10-CM | POA: Diagnosis not present

## 2015-08-26 DIAGNOSIS — M79651 Pain in right thigh: Secondary | ICD-10-CM | POA: Diagnosis not present

## 2015-08-27 DIAGNOSIS — F419 Anxiety disorder, unspecified: Secondary | ICD-10-CM | POA: Diagnosis not present

## 2015-08-27 DIAGNOSIS — M069 Rheumatoid arthritis, unspecified: Secondary | ICD-10-CM | POA: Diagnosis not present

## 2015-08-27 DIAGNOSIS — I6930 Unspecified sequelae of cerebral infarction: Secondary | ICD-10-CM | POA: Diagnosis not present

## 2015-08-27 DIAGNOSIS — G4700 Insomnia, unspecified: Secondary | ICD-10-CM | POA: Diagnosis not present

## 2015-09-26 DIAGNOSIS — M79642 Pain in left hand: Secondary | ICD-10-CM | POA: Diagnosis not present

## 2015-09-26 DIAGNOSIS — G8929 Other chronic pain: Secondary | ICD-10-CM | POA: Diagnosis not present

## 2015-09-26 DIAGNOSIS — M899 Disorder of bone, unspecified: Secondary | ICD-10-CM | POA: Diagnosis not present

## 2015-09-26 DIAGNOSIS — M0579 Rheumatoid arthritis with rheumatoid factor of multiple sites without organ or systems involvement: Secondary | ICD-10-CM | POA: Diagnosis not present

## 2015-09-26 DIAGNOSIS — M25511 Pain in right shoulder: Secondary | ICD-10-CM | POA: Diagnosis not present

## 2015-10-01 DIAGNOSIS — M069 Rheumatoid arthritis, unspecified: Secondary | ICD-10-CM | POA: Diagnosis not present

## 2015-10-01 DIAGNOSIS — I6930 Unspecified sequelae of cerebral infarction: Secondary | ICD-10-CM | POA: Diagnosis not present

## 2015-10-01 DIAGNOSIS — F419 Anxiety disorder, unspecified: Secondary | ICD-10-CM | POA: Diagnosis not present

## 2015-10-01 DIAGNOSIS — G4700 Insomnia, unspecified: Secondary | ICD-10-CM | POA: Diagnosis not present

## 2015-10-15 DIAGNOSIS — F419 Anxiety disorder, unspecified: Secondary | ICD-10-CM | POA: Diagnosis not present

## 2015-10-15 DIAGNOSIS — I6930 Unspecified sequelae of cerebral infarction: Secondary | ICD-10-CM | POA: Diagnosis not present

## 2015-10-15 DIAGNOSIS — I350 Nonrheumatic aortic (valve) stenosis: Secondary | ICD-10-CM | POA: Diagnosis not present

## 2015-10-15 DIAGNOSIS — I4891 Unspecified atrial fibrillation: Secondary | ICD-10-CM | POA: Diagnosis not present

## 2015-10-15 DIAGNOSIS — I517 Cardiomegaly: Secondary | ICD-10-CM | POA: Diagnosis not present

## 2015-10-15 DIAGNOSIS — I214 Non-ST elevation (NSTEMI) myocardial infarction: Secondary | ICD-10-CM | POA: Diagnosis not present

## 2015-10-15 DIAGNOSIS — I639 Cerebral infarction, unspecified: Secondary | ICD-10-CM | POA: Diagnosis not present

## 2015-10-15 DIAGNOSIS — G4700 Insomnia, unspecified: Secondary | ICD-10-CM | POA: Diagnosis not present

## 2015-10-15 DIAGNOSIS — M069 Rheumatoid arthritis, unspecified: Secondary | ICD-10-CM | POA: Diagnosis not present

## 2015-10-15 DIAGNOSIS — M6281 Muscle weakness (generalized): Secondary | ICD-10-CM | POA: Diagnosis not present

## 2015-10-15 DIAGNOSIS — I2510 Atherosclerotic heart disease of native coronary artery without angina pectoris: Secondary | ICD-10-CM | POA: Diagnosis not present

## 2015-10-15 DIAGNOSIS — R262 Difficulty in walking, not elsewhere classified: Secondary | ICD-10-CM | POA: Diagnosis not present

## 2015-10-16 DIAGNOSIS — M6281 Muscle weakness (generalized): Secondary | ICD-10-CM | POA: Diagnosis not present

## 2015-10-16 DIAGNOSIS — R262 Difficulty in walking, not elsewhere classified: Secondary | ICD-10-CM | POA: Diagnosis not present

## 2015-10-16 DIAGNOSIS — I639 Cerebral infarction, unspecified: Secondary | ICD-10-CM | POA: Diagnosis not present

## 2015-10-16 DIAGNOSIS — I2510 Atherosclerotic heart disease of native coronary artery without angina pectoris: Secondary | ICD-10-CM | POA: Diagnosis not present

## 2015-10-16 DIAGNOSIS — I517 Cardiomegaly: Secondary | ICD-10-CM | POA: Diagnosis not present

## 2015-10-16 DIAGNOSIS — I214 Non-ST elevation (NSTEMI) myocardial infarction: Secondary | ICD-10-CM | POA: Diagnosis not present

## 2015-10-17 ENCOUNTER — Other Ambulatory Visit: Payer: Self-pay | Admitting: Family Medicine

## 2015-10-17 DIAGNOSIS — I639 Cerebral infarction, unspecified: Secondary | ICD-10-CM | POA: Diagnosis not present

## 2015-10-17 DIAGNOSIS — I214 Non-ST elevation (NSTEMI) myocardial infarction: Secondary | ICD-10-CM | POA: Diagnosis not present

## 2015-10-17 DIAGNOSIS — R222 Localized swelling, mass and lump, trunk: Secondary | ICD-10-CM

## 2015-10-17 DIAGNOSIS — E784 Other hyperlipidemia: Secondary | ICD-10-CM | POA: Diagnosis not present

## 2015-10-17 DIAGNOSIS — I6350 Cerebral infarction due to unspecified occlusion or stenosis of unspecified cerebral artery: Secondary | ICD-10-CM | POA: Diagnosis not present

## 2015-10-17 DIAGNOSIS — M6281 Muscle weakness (generalized): Secondary | ICD-10-CM | POA: Diagnosis not present

## 2015-10-17 DIAGNOSIS — I517 Cardiomegaly: Secondary | ICD-10-CM | POA: Diagnosis not present

## 2015-10-17 DIAGNOSIS — I10 Essential (primary) hypertension: Secondary | ICD-10-CM | POA: Diagnosis not present

## 2015-10-17 DIAGNOSIS — R262 Difficulty in walking, not elsewhere classified: Secondary | ICD-10-CM | POA: Diagnosis not present

## 2015-10-17 DIAGNOSIS — I509 Heart failure, unspecified: Secondary | ICD-10-CM | POA: Diagnosis not present

## 2015-10-17 DIAGNOSIS — I4891 Unspecified atrial fibrillation: Secondary | ICD-10-CM | POA: Diagnosis not present

## 2015-10-17 DIAGNOSIS — D649 Anemia, unspecified: Secondary | ICD-10-CM | POA: Diagnosis not present

## 2015-10-17 DIAGNOSIS — M810 Age-related osteoporosis without current pathological fracture: Secondary | ICD-10-CM | POA: Diagnosis not present

## 2015-10-17 DIAGNOSIS — I2510 Atherosclerotic heart disease of native coronary artery without angina pectoris: Secondary | ICD-10-CM | POA: Diagnosis not present

## 2015-10-18 DIAGNOSIS — I2510 Atherosclerotic heart disease of native coronary artery without angina pectoris: Secondary | ICD-10-CM | POA: Diagnosis not present

## 2015-10-18 DIAGNOSIS — R262 Difficulty in walking, not elsewhere classified: Secondary | ICD-10-CM | POA: Diagnosis not present

## 2015-10-18 DIAGNOSIS — I517 Cardiomegaly: Secondary | ICD-10-CM | POA: Diagnosis not present

## 2015-10-18 DIAGNOSIS — M6281 Muscle weakness (generalized): Secondary | ICD-10-CM | POA: Diagnosis not present

## 2015-10-18 DIAGNOSIS — I214 Non-ST elevation (NSTEMI) myocardial infarction: Secondary | ICD-10-CM | POA: Diagnosis not present

## 2015-10-18 DIAGNOSIS — I639 Cerebral infarction, unspecified: Secondary | ICD-10-CM | POA: Diagnosis not present

## 2015-10-21 DIAGNOSIS — I517 Cardiomegaly: Secondary | ICD-10-CM | POA: Diagnosis not present

## 2015-10-21 DIAGNOSIS — I639 Cerebral infarction, unspecified: Secondary | ICD-10-CM | POA: Diagnosis not present

## 2015-10-21 DIAGNOSIS — R262 Difficulty in walking, not elsewhere classified: Secondary | ICD-10-CM | POA: Diagnosis not present

## 2015-10-21 DIAGNOSIS — M6281 Muscle weakness (generalized): Secondary | ICD-10-CM | POA: Diagnosis not present

## 2015-10-21 DIAGNOSIS — I2510 Atherosclerotic heart disease of native coronary artery without angina pectoris: Secondary | ICD-10-CM | POA: Diagnosis not present

## 2015-10-21 DIAGNOSIS — I214 Non-ST elevation (NSTEMI) myocardial infarction: Secondary | ICD-10-CM | POA: Diagnosis not present

## 2015-10-22 DIAGNOSIS — I639 Cerebral infarction, unspecified: Secondary | ICD-10-CM | POA: Diagnosis not present

## 2015-10-22 DIAGNOSIS — I2510 Atherosclerotic heart disease of native coronary artery without angina pectoris: Secondary | ICD-10-CM | POA: Diagnosis not present

## 2015-10-22 DIAGNOSIS — M6281 Muscle weakness (generalized): Secondary | ICD-10-CM | POA: Diagnosis not present

## 2015-10-22 DIAGNOSIS — R262 Difficulty in walking, not elsewhere classified: Secondary | ICD-10-CM | POA: Diagnosis not present

## 2015-10-22 DIAGNOSIS — I517 Cardiomegaly: Secondary | ICD-10-CM | POA: Diagnosis not present

## 2015-10-22 DIAGNOSIS — I214 Non-ST elevation (NSTEMI) myocardial infarction: Secondary | ICD-10-CM | POA: Diagnosis not present

## 2015-10-23 DIAGNOSIS — I214 Non-ST elevation (NSTEMI) myocardial infarction: Secondary | ICD-10-CM | POA: Diagnosis not present

## 2015-10-23 DIAGNOSIS — I2510 Atherosclerotic heart disease of native coronary artery without angina pectoris: Secondary | ICD-10-CM | POA: Diagnosis not present

## 2015-10-23 DIAGNOSIS — R262 Difficulty in walking, not elsewhere classified: Secondary | ICD-10-CM | POA: Diagnosis not present

## 2015-10-23 DIAGNOSIS — M6281 Muscle weakness (generalized): Secondary | ICD-10-CM | POA: Diagnosis not present

## 2015-10-23 DIAGNOSIS — I517 Cardiomegaly: Secondary | ICD-10-CM | POA: Diagnosis not present

## 2015-10-23 DIAGNOSIS — I639 Cerebral infarction, unspecified: Secondary | ICD-10-CM | POA: Diagnosis not present

## 2015-10-24 DIAGNOSIS — M6281 Muscle weakness (generalized): Secondary | ICD-10-CM | POA: Diagnosis not present

## 2015-10-24 DIAGNOSIS — R262 Difficulty in walking, not elsewhere classified: Secondary | ICD-10-CM | POA: Diagnosis not present

## 2015-10-24 DIAGNOSIS — I2510 Atherosclerotic heart disease of native coronary artery without angina pectoris: Secondary | ICD-10-CM | POA: Diagnosis not present

## 2015-10-24 DIAGNOSIS — I639 Cerebral infarction, unspecified: Secondary | ICD-10-CM | POA: Diagnosis not present

## 2015-10-24 DIAGNOSIS — I214 Non-ST elevation (NSTEMI) myocardial infarction: Secondary | ICD-10-CM | POA: Diagnosis not present

## 2015-10-24 DIAGNOSIS — I517 Cardiomegaly: Secondary | ICD-10-CM | POA: Diagnosis not present

## 2015-10-25 DIAGNOSIS — I517 Cardiomegaly: Secondary | ICD-10-CM | POA: Diagnosis not present

## 2015-10-25 DIAGNOSIS — M6281 Muscle weakness (generalized): Secondary | ICD-10-CM | POA: Diagnosis not present

## 2015-10-25 DIAGNOSIS — I2510 Atherosclerotic heart disease of native coronary artery without angina pectoris: Secondary | ICD-10-CM | POA: Diagnosis not present

## 2015-10-25 DIAGNOSIS — I639 Cerebral infarction, unspecified: Secondary | ICD-10-CM | POA: Diagnosis not present

## 2015-10-25 DIAGNOSIS — R262 Difficulty in walking, not elsewhere classified: Secondary | ICD-10-CM | POA: Diagnosis not present

## 2015-10-25 DIAGNOSIS — I214 Non-ST elevation (NSTEMI) myocardial infarction: Secondary | ICD-10-CM | POA: Diagnosis not present

## 2015-10-28 DIAGNOSIS — I2510 Atherosclerotic heart disease of native coronary artery without angina pectoris: Secondary | ICD-10-CM | POA: Diagnosis not present

## 2015-10-28 DIAGNOSIS — M6281 Muscle weakness (generalized): Secondary | ICD-10-CM | POA: Diagnosis not present

## 2015-10-28 DIAGNOSIS — R262 Difficulty in walking, not elsewhere classified: Secondary | ICD-10-CM | POA: Diagnosis not present

## 2015-10-28 DIAGNOSIS — I639 Cerebral infarction, unspecified: Secondary | ICD-10-CM | POA: Diagnosis not present

## 2015-10-28 DIAGNOSIS — I517 Cardiomegaly: Secondary | ICD-10-CM | POA: Diagnosis not present

## 2015-10-28 DIAGNOSIS — I214 Non-ST elevation (NSTEMI) myocardial infarction: Secondary | ICD-10-CM | POA: Diagnosis not present

## 2015-10-28 DIAGNOSIS — I350 Nonrheumatic aortic (valve) stenosis: Secondary | ICD-10-CM | POA: Diagnosis not present

## 2015-10-28 DIAGNOSIS — I4891 Unspecified atrial fibrillation: Secondary | ICD-10-CM | POA: Diagnosis not present

## 2015-10-29 ENCOUNTER — Ambulatory Visit
Admission: RE | Admit: 2015-10-29 | Discharge: 2015-10-29 | Disposition: A | Discharge status: Home / Self Care | Payer: Medicare Other | Source: Ambulatory Visit | Attending: Family Medicine | Admitting: Family Medicine

## 2015-10-29 DIAGNOSIS — R222 Localized swelling, mass and lump, trunk: Secondary | ICD-10-CM | POA: Diagnosis not present

## 2015-10-29 DIAGNOSIS — K449 Diaphragmatic hernia without obstruction or gangrene: Secondary | ICD-10-CM | POA: Insufficient documentation

## 2015-10-29 DIAGNOSIS — M898X8 Other specified disorders of bone, other site: Secondary | ICD-10-CM | POA: Insufficient documentation

## 2015-10-29 DIAGNOSIS — I639 Cerebral infarction, unspecified: Secondary | ICD-10-CM | POA: Diagnosis not present

## 2015-10-29 DIAGNOSIS — M6281 Muscle weakness (generalized): Secondary | ICD-10-CM | POA: Diagnosis not present

## 2015-10-29 DIAGNOSIS — K8020 Calculus of gallbladder without cholecystitis without obstruction: Secondary | ICD-10-CM | POA: Diagnosis not present

## 2015-10-29 DIAGNOSIS — I517 Cardiomegaly: Secondary | ICD-10-CM | POA: Diagnosis not present

## 2015-10-29 DIAGNOSIS — Z952 Presence of prosthetic heart valve: Secondary | ICD-10-CM | POA: Insufficient documentation

## 2015-10-29 DIAGNOSIS — I700 Atherosclerosis of aorta: Secondary | ICD-10-CM | POA: Diagnosis not present

## 2015-10-29 DIAGNOSIS — I2510 Atherosclerotic heart disease of native coronary artery without angina pectoris: Secondary | ICD-10-CM | POA: Diagnosis not present

## 2015-10-29 DIAGNOSIS — Z951 Presence of aortocoronary bypass graft: Secondary | ICD-10-CM | POA: Diagnosis not present

## 2015-10-29 DIAGNOSIS — I214 Non-ST elevation (NSTEMI) myocardial infarction: Secondary | ICD-10-CM | POA: Diagnosis not present

## 2015-10-29 DIAGNOSIS — R918 Other nonspecific abnormal finding of lung field: Secondary | ICD-10-CM | POA: Diagnosis not present

## 2015-10-29 DIAGNOSIS — R262 Difficulty in walking, not elsewhere classified: Secondary | ICD-10-CM | POA: Diagnosis not present

## 2015-10-29 LAB — POCT I-STAT CREATININE: Creatinine, Ser: 0.6 mg/dL (ref 0.44–1.00)

## 2015-10-29 MED ORDER — IOPAMIDOL (ISOVUE-300) INJECTION 61%
75.0000 mL | Freq: Once | INTRAVENOUS | Status: AC | PRN
  Administered 2015-10-29: 75 mL via INTRAVENOUS

## 2015-10-30 DIAGNOSIS — I340 Nonrheumatic mitral (valve) insufficiency: Secondary | ICD-10-CM | POA: Diagnosis not present

## 2015-10-30 DIAGNOSIS — R0789 Other chest pain: Secondary | ICD-10-CM | POA: Diagnosis not present

## 2015-10-30 DIAGNOSIS — I25810 Atherosclerosis of coronary artery bypass graft(s) without angina pectoris: Secondary | ICD-10-CM | POA: Diagnosis not present

## 2015-10-30 DIAGNOSIS — I350 Nonrheumatic aortic (valve) stenosis: Secondary | ICD-10-CM | POA: Diagnosis not present

## 2015-10-31 DIAGNOSIS — I517 Cardiomegaly: Secondary | ICD-10-CM | POA: Diagnosis not present

## 2015-10-31 DIAGNOSIS — I639 Cerebral infarction, unspecified: Secondary | ICD-10-CM | POA: Diagnosis not present

## 2015-10-31 DIAGNOSIS — I2510 Atherosclerotic heart disease of native coronary artery without angina pectoris: Secondary | ICD-10-CM | POA: Diagnosis not present

## 2015-10-31 DIAGNOSIS — I214 Non-ST elevation (NSTEMI) myocardial infarction: Secondary | ICD-10-CM | POA: Diagnosis not present

## 2015-10-31 DIAGNOSIS — Z7901 Long term (current) use of anticoagulants: Secondary | ICD-10-CM | POA: Diagnosis not present

## 2015-10-31 DIAGNOSIS — T814XXD Infection following a procedure, subsequent encounter: Secondary | ICD-10-CM | POA: Diagnosis not present

## 2015-10-31 DIAGNOSIS — R222 Localized swelling, mass and lump, trunk: Secondary | ICD-10-CM | POA: Diagnosis not present

## 2015-10-31 DIAGNOSIS — Z951 Presence of aortocoronary bypass graft: Secondary | ICD-10-CM | POA: Diagnosis not present

## 2015-10-31 DIAGNOSIS — R262 Difficulty in walking, not elsewhere classified: Secondary | ICD-10-CM | POA: Diagnosis not present

## 2015-10-31 DIAGNOSIS — M6281 Muscle weakness (generalized): Secondary | ICD-10-CM | POA: Diagnosis not present

## 2015-11-01 DIAGNOSIS — R262 Difficulty in walking, not elsewhere classified: Secondary | ICD-10-CM | POA: Diagnosis not present

## 2015-11-01 DIAGNOSIS — I2510 Atherosclerotic heart disease of native coronary artery without angina pectoris: Secondary | ICD-10-CM | POA: Diagnosis not present

## 2015-11-01 DIAGNOSIS — I639 Cerebral infarction, unspecified: Secondary | ICD-10-CM | POA: Diagnosis not present

## 2015-11-01 DIAGNOSIS — M6281 Muscle weakness (generalized): Secondary | ICD-10-CM | POA: Diagnosis not present

## 2015-11-01 DIAGNOSIS — I214 Non-ST elevation (NSTEMI) myocardial infarction: Secondary | ICD-10-CM | POA: Diagnosis not present

## 2015-11-01 DIAGNOSIS — I517 Cardiomegaly: Secondary | ICD-10-CM | POA: Diagnosis not present

## 2015-11-04 DIAGNOSIS — I214 Non-ST elevation (NSTEMI) myocardial infarction: Secondary | ICD-10-CM | POA: Diagnosis not present

## 2015-11-04 DIAGNOSIS — I639 Cerebral infarction, unspecified: Secondary | ICD-10-CM | POA: Diagnosis not present

## 2015-11-04 DIAGNOSIS — M6281 Muscle weakness (generalized): Secondary | ICD-10-CM | POA: Diagnosis not present

## 2015-11-04 DIAGNOSIS — I517 Cardiomegaly: Secondary | ICD-10-CM | POA: Diagnosis not present

## 2015-11-04 DIAGNOSIS — R262 Difficulty in walking, not elsewhere classified: Secondary | ICD-10-CM | POA: Diagnosis not present

## 2015-11-04 DIAGNOSIS — I2510 Atherosclerotic heart disease of native coronary artery without angina pectoris: Secondary | ICD-10-CM | POA: Diagnosis not present

## 2015-11-05 DIAGNOSIS — I639 Cerebral infarction, unspecified: Secondary | ICD-10-CM | POA: Diagnosis not present

## 2015-11-05 DIAGNOSIS — M6281 Muscle weakness (generalized): Secondary | ICD-10-CM | POA: Diagnosis not present

## 2015-11-05 DIAGNOSIS — R262 Difficulty in walking, not elsewhere classified: Secondary | ICD-10-CM | POA: Diagnosis not present

## 2015-11-05 DIAGNOSIS — I517 Cardiomegaly: Secondary | ICD-10-CM | POA: Diagnosis not present

## 2015-11-05 DIAGNOSIS — I2510 Atherosclerotic heart disease of native coronary artery without angina pectoris: Secondary | ICD-10-CM | POA: Diagnosis not present

## 2015-11-05 DIAGNOSIS — I214 Non-ST elevation (NSTEMI) myocardial infarction: Secondary | ICD-10-CM | POA: Diagnosis not present

## 2015-11-06 DIAGNOSIS — I517 Cardiomegaly: Secondary | ICD-10-CM | POA: Diagnosis not present

## 2015-11-06 DIAGNOSIS — I214 Non-ST elevation (NSTEMI) myocardial infarction: Secondary | ICD-10-CM | POA: Diagnosis not present

## 2015-11-06 DIAGNOSIS — M6281 Muscle weakness (generalized): Secondary | ICD-10-CM | POA: Diagnosis not present

## 2015-11-06 DIAGNOSIS — I639 Cerebral infarction, unspecified: Secondary | ICD-10-CM | POA: Diagnosis not present

## 2015-11-06 DIAGNOSIS — I2510 Atherosclerotic heart disease of native coronary artery without angina pectoris: Secondary | ICD-10-CM | POA: Diagnosis not present

## 2015-11-06 DIAGNOSIS — R262 Difficulty in walking, not elsewhere classified: Secondary | ICD-10-CM | POA: Diagnosis not present

## 2015-11-07 DIAGNOSIS — I517 Cardiomegaly: Secondary | ICD-10-CM | POA: Diagnosis not present

## 2015-11-07 DIAGNOSIS — M6281 Muscle weakness (generalized): Secondary | ICD-10-CM | POA: Diagnosis not present

## 2015-11-07 DIAGNOSIS — I2510 Atherosclerotic heart disease of native coronary artery without angina pectoris: Secondary | ICD-10-CM | POA: Diagnosis not present

## 2015-11-07 DIAGNOSIS — I639 Cerebral infarction, unspecified: Secondary | ICD-10-CM | POA: Diagnosis not present

## 2015-11-07 DIAGNOSIS — I214 Non-ST elevation (NSTEMI) myocardial infarction: Secondary | ICD-10-CM | POA: Diagnosis not present

## 2015-11-07 DIAGNOSIS — R262 Difficulty in walking, not elsewhere classified: Secondary | ICD-10-CM | POA: Diagnosis not present

## 2015-11-08 DIAGNOSIS — I517 Cardiomegaly: Secondary | ICD-10-CM | POA: Diagnosis not present

## 2015-11-08 DIAGNOSIS — I214 Non-ST elevation (NSTEMI) myocardial infarction: Secondary | ICD-10-CM | POA: Diagnosis not present

## 2015-11-08 DIAGNOSIS — I2510 Atherosclerotic heart disease of native coronary artery without angina pectoris: Secondary | ICD-10-CM | POA: Diagnosis not present

## 2015-11-08 DIAGNOSIS — R262 Difficulty in walking, not elsewhere classified: Secondary | ICD-10-CM | POA: Diagnosis not present

## 2015-11-08 DIAGNOSIS — M6281 Muscle weakness (generalized): Secondary | ICD-10-CM | POA: Diagnosis not present

## 2015-11-08 DIAGNOSIS — I639 Cerebral infarction, unspecified: Secondary | ICD-10-CM | POA: Diagnosis not present

## 2015-11-11 DIAGNOSIS — I2510 Atherosclerotic heart disease of native coronary artery without angina pectoris: Secondary | ICD-10-CM | POA: Diagnosis not present

## 2015-11-11 DIAGNOSIS — M6281 Muscle weakness (generalized): Secondary | ICD-10-CM | POA: Diagnosis not present

## 2015-11-11 DIAGNOSIS — I639 Cerebral infarction, unspecified: Secondary | ICD-10-CM | POA: Diagnosis not present

## 2015-11-11 DIAGNOSIS — I214 Non-ST elevation (NSTEMI) myocardial infarction: Secondary | ICD-10-CM | POA: Diagnosis not present

## 2015-11-11 DIAGNOSIS — R262 Difficulty in walking, not elsewhere classified: Secondary | ICD-10-CM | POA: Diagnosis not present

## 2015-11-11 DIAGNOSIS — I517 Cardiomegaly: Secondary | ICD-10-CM | POA: Diagnosis not present

## 2015-11-12 DIAGNOSIS — I2510 Atherosclerotic heart disease of native coronary artery without angina pectoris: Secondary | ICD-10-CM | POA: Diagnosis not present

## 2015-11-12 DIAGNOSIS — M6281 Muscle weakness (generalized): Secondary | ICD-10-CM | POA: Diagnosis not present

## 2015-11-12 DIAGNOSIS — R262 Difficulty in walking, not elsewhere classified: Secondary | ICD-10-CM | POA: Diagnosis not present

## 2015-11-12 DIAGNOSIS — I517 Cardiomegaly: Secondary | ICD-10-CM | POA: Diagnosis not present

## 2015-11-12 DIAGNOSIS — I639 Cerebral infarction, unspecified: Secondary | ICD-10-CM | POA: Diagnosis not present

## 2015-11-12 DIAGNOSIS — I214 Non-ST elevation (NSTEMI) myocardial infarction: Secondary | ICD-10-CM | POA: Diagnosis not present

## 2015-11-13 DIAGNOSIS — I214 Non-ST elevation (NSTEMI) myocardial infarction: Secondary | ICD-10-CM | POA: Diagnosis not present

## 2015-11-13 DIAGNOSIS — I2510 Atherosclerotic heart disease of native coronary artery without angina pectoris: Secondary | ICD-10-CM | POA: Diagnosis not present

## 2015-11-13 DIAGNOSIS — I639 Cerebral infarction, unspecified: Secondary | ICD-10-CM | POA: Diagnosis not present

## 2015-11-13 DIAGNOSIS — M6281 Muscle weakness (generalized): Secondary | ICD-10-CM | POA: Diagnosis not present

## 2015-11-13 DIAGNOSIS — R262 Difficulty in walking, not elsewhere classified: Secondary | ICD-10-CM | POA: Diagnosis not present

## 2015-11-13 DIAGNOSIS — I517 Cardiomegaly: Secondary | ICD-10-CM | POA: Diagnosis not present

## 2015-11-14 DIAGNOSIS — I2510 Atherosclerotic heart disease of native coronary artery without angina pectoris: Secondary | ICD-10-CM | POA: Diagnosis not present

## 2015-11-14 DIAGNOSIS — M6281 Muscle weakness (generalized): Secondary | ICD-10-CM | POA: Diagnosis not present

## 2015-11-14 DIAGNOSIS — I214 Non-ST elevation (NSTEMI) myocardial infarction: Secondary | ICD-10-CM | POA: Diagnosis not present

## 2015-11-14 DIAGNOSIS — I517 Cardiomegaly: Secondary | ICD-10-CM | POA: Diagnosis not present

## 2015-11-14 DIAGNOSIS — I639 Cerebral infarction, unspecified: Secondary | ICD-10-CM | POA: Diagnosis not present

## 2015-11-14 DIAGNOSIS — R262 Difficulty in walking, not elsewhere classified: Secondary | ICD-10-CM | POA: Diagnosis not present

## 2015-11-15 DIAGNOSIS — I639 Cerebral infarction, unspecified: Secondary | ICD-10-CM | POA: Diagnosis not present

## 2015-11-15 DIAGNOSIS — I214 Non-ST elevation (NSTEMI) myocardial infarction: Secondary | ICD-10-CM | POA: Diagnosis not present

## 2015-11-15 DIAGNOSIS — I517 Cardiomegaly: Secondary | ICD-10-CM | POA: Diagnosis not present

## 2015-11-15 DIAGNOSIS — R262 Difficulty in walking, not elsewhere classified: Secondary | ICD-10-CM | POA: Diagnosis not present

## 2015-11-15 DIAGNOSIS — M6281 Muscle weakness (generalized): Secondary | ICD-10-CM | POA: Diagnosis not present

## 2015-11-15 DIAGNOSIS — I2510 Atherosclerotic heart disease of native coronary artery without angina pectoris: Secondary | ICD-10-CM | POA: Diagnosis not present

## 2015-11-19 DIAGNOSIS — I214 Non-ST elevation (NSTEMI) myocardial infarction: Secondary | ICD-10-CM | POA: Diagnosis not present

## 2015-11-19 DIAGNOSIS — M6281 Muscle weakness (generalized): Secondary | ICD-10-CM | POA: Diagnosis not present

## 2015-11-19 DIAGNOSIS — I517 Cardiomegaly: Secondary | ICD-10-CM | POA: Diagnosis not present

## 2015-11-19 DIAGNOSIS — I639 Cerebral infarction, unspecified: Secondary | ICD-10-CM | POA: Diagnosis not present

## 2015-11-19 DIAGNOSIS — I6930 Unspecified sequelae of cerebral infarction: Secondary | ICD-10-CM | POA: Diagnosis not present

## 2015-11-19 DIAGNOSIS — I2510 Atherosclerotic heart disease of native coronary artery without angina pectoris: Secondary | ICD-10-CM | POA: Diagnosis not present

## 2015-11-19 DIAGNOSIS — R262 Difficulty in walking, not elsewhere classified: Secondary | ICD-10-CM | POA: Diagnosis not present

## 2015-11-19 DIAGNOSIS — F419 Anxiety disorder, unspecified: Secondary | ICD-10-CM | POA: Diagnosis not present

## 2015-11-19 DIAGNOSIS — G4700 Insomnia, unspecified: Secondary | ICD-10-CM | POA: Diagnosis not present

## 2015-11-19 DIAGNOSIS — M069 Rheumatoid arthritis, unspecified: Secondary | ICD-10-CM | POA: Diagnosis not present

## 2015-11-20 DIAGNOSIS — I214 Non-ST elevation (NSTEMI) myocardial infarction: Secondary | ICD-10-CM | POA: Diagnosis not present

## 2015-11-20 DIAGNOSIS — I639 Cerebral infarction, unspecified: Secondary | ICD-10-CM | POA: Diagnosis not present

## 2015-11-20 DIAGNOSIS — M6281 Muscle weakness (generalized): Secondary | ICD-10-CM | POA: Diagnosis not present

## 2015-11-20 DIAGNOSIS — R262 Difficulty in walking, not elsewhere classified: Secondary | ICD-10-CM | POA: Diagnosis not present

## 2015-11-20 DIAGNOSIS — I517 Cardiomegaly: Secondary | ICD-10-CM | POA: Diagnosis not present

## 2015-11-20 DIAGNOSIS — I2510 Atherosclerotic heart disease of native coronary artery without angina pectoris: Secondary | ICD-10-CM | POA: Diagnosis not present

## 2015-11-21 DIAGNOSIS — I214 Non-ST elevation (NSTEMI) myocardial infarction: Secondary | ICD-10-CM | POA: Diagnosis not present

## 2015-11-21 DIAGNOSIS — R262 Difficulty in walking, not elsewhere classified: Secondary | ICD-10-CM | POA: Diagnosis not present

## 2015-11-21 DIAGNOSIS — I2510 Atherosclerotic heart disease of native coronary artery without angina pectoris: Secondary | ICD-10-CM | POA: Diagnosis not present

## 2015-11-21 DIAGNOSIS — M6281 Muscle weakness (generalized): Secondary | ICD-10-CM | POA: Diagnosis not present

## 2015-11-21 DIAGNOSIS — R531 Weakness: Secondary | ICD-10-CM | POA: Diagnosis not present

## 2015-11-21 DIAGNOSIS — I517 Cardiomegaly: Secondary | ICD-10-CM | POA: Diagnosis not present

## 2015-11-21 DIAGNOSIS — I639 Cerebral infarction, unspecified: Secondary | ICD-10-CM | POA: Diagnosis not present

## 2015-11-22 DIAGNOSIS — I517 Cardiomegaly: Secondary | ICD-10-CM | POA: Diagnosis not present

## 2015-11-22 DIAGNOSIS — I639 Cerebral infarction, unspecified: Secondary | ICD-10-CM | POA: Diagnosis not present

## 2015-11-22 DIAGNOSIS — M6281 Muscle weakness (generalized): Secondary | ICD-10-CM | POA: Diagnosis not present

## 2015-11-22 DIAGNOSIS — I214 Non-ST elevation (NSTEMI) myocardial infarction: Secondary | ICD-10-CM | POA: Diagnosis not present

## 2015-11-22 DIAGNOSIS — R262 Difficulty in walking, not elsewhere classified: Secondary | ICD-10-CM | POA: Diagnosis not present

## 2015-11-22 DIAGNOSIS — I2510 Atherosclerotic heart disease of native coronary artery without angina pectoris: Secondary | ICD-10-CM | POA: Diagnosis not present

## 2015-11-25 DIAGNOSIS — I639 Cerebral infarction, unspecified: Secondary | ICD-10-CM | POA: Diagnosis not present

## 2015-11-25 DIAGNOSIS — I2510 Atherosclerotic heart disease of native coronary artery without angina pectoris: Secondary | ICD-10-CM | POA: Diagnosis not present

## 2015-11-25 DIAGNOSIS — R262 Difficulty in walking, not elsewhere classified: Secondary | ICD-10-CM | POA: Diagnosis not present

## 2015-11-25 DIAGNOSIS — I214 Non-ST elevation (NSTEMI) myocardial infarction: Secondary | ICD-10-CM | POA: Diagnosis not present

## 2015-11-25 DIAGNOSIS — M6281 Muscle weakness (generalized): Secondary | ICD-10-CM | POA: Diagnosis not present

## 2015-11-25 DIAGNOSIS — I517 Cardiomegaly: Secondary | ICD-10-CM | POA: Diagnosis not present

## 2015-11-26 DIAGNOSIS — I639 Cerebral infarction, unspecified: Secondary | ICD-10-CM | POA: Diagnosis not present

## 2015-11-26 DIAGNOSIS — G8929 Other chronic pain: Secondary | ICD-10-CM | POA: Diagnosis not present

## 2015-11-26 DIAGNOSIS — I2510 Atherosclerotic heart disease of native coronary artery without angina pectoris: Secondary | ICD-10-CM | POA: Diagnosis not present

## 2015-11-26 DIAGNOSIS — M0579 Rheumatoid arthritis with rheumatoid factor of multiple sites without organ or systems involvement: Secondary | ICD-10-CM | POA: Diagnosis not present

## 2015-11-26 DIAGNOSIS — M25511 Pain in right shoulder: Secondary | ICD-10-CM | POA: Diagnosis not present

## 2015-11-26 DIAGNOSIS — I214 Non-ST elevation (NSTEMI) myocardial infarction: Secondary | ICD-10-CM | POA: Diagnosis not present

## 2015-11-26 DIAGNOSIS — M899 Disorder of bone, unspecified: Secondary | ICD-10-CM | POA: Diagnosis not present

## 2015-11-26 DIAGNOSIS — I517 Cardiomegaly: Secondary | ICD-10-CM | POA: Diagnosis not present

## 2015-11-26 DIAGNOSIS — R262 Difficulty in walking, not elsewhere classified: Secondary | ICD-10-CM | POA: Diagnosis not present

## 2015-11-26 DIAGNOSIS — M6281 Muscle weakness (generalized): Secondary | ICD-10-CM | POA: Diagnosis not present

## 2015-11-27 DIAGNOSIS — M6281 Muscle weakness (generalized): Secondary | ICD-10-CM | POA: Diagnosis not present

## 2015-11-28 DIAGNOSIS — I10 Essential (primary) hypertension: Secondary | ICD-10-CM | POA: Diagnosis not present

## 2015-11-28 DIAGNOSIS — M6281 Muscle weakness (generalized): Secondary | ICD-10-CM | POA: Diagnosis not present

## 2015-11-28 DIAGNOSIS — I4891 Unspecified atrial fibrillation: Secondary | ICD-10-CM | POA: Diagnosis not present

## 2015-11-28 DIAGNOSIS — E784 Other hyperlipidemia: Secondary | ICD-10-CM | POA: Diagnosis not present

## 2015-11-28 DIAGNOSIS — I2510 Atherosclerotic heart disease of native coronary artery without angina pectoris: Secondary | ICD-10-CM | POA: Diagnosis not present

## 2015-11-28 DIAGNOSIS — F419 Anxiety disorder, unspecified: Secondary | ICD-10-CM | POA: Diagnosis not present

## 2015-11-28 DIAGNOSIS — F329 Major depressive disorder, single episode, unspecified: Secondary | ICD-10-CM | POA: Diagnosis not present

## 2015-11-28 DIAGNOSIS — K5900 Constipation, unspecified: Secondary | ICD-10-CM | POA: Diagnosis not present

## 2015-11-28 DIAGNOSIS — R110 Nausea: Secondary | ICD-10-CM | POA: Diagnosis not present

## 2015-11-28 DIAGNOSIS — M810 Age-related osteoporosis without current pathological fracture: Secondary | ICD-10-CM | POA: Diagnosis not present

## 2015-11-28 DIAGNOSIS — I6350 Cerebral infarction due to unspecified occlusion or stenosis of unspecified cerebral artery: Secondary | ICD-10-CM | POA: Diagnosis not present

## 2015-11-28 DIAGNOSIS — M069 Rheumatoid arthritis, unspecified: Secondary | ICD-10-CM | POA: Diagnosis not present

## 2015-11-28 DIAGNOSIS — D649 Anemia, unspecified: Secondary | ICD-10-CM | POA: Diagnosis not present

## 2015-11-29 DIAGNOSIS — M069 Rheumatoid arthritis, unspecified: Secondary | ICD-10-CM | POA: Diagnosis not present

## 2015-11-29 DIAGNOSIS — M0579 Rheumatoid arthritis with rheumatoid factor of multiple sites without organ or systems involvement: Secondary | ICD-10-CM | POA: Diagnosis not present

## 2015-11-29 DIAGNOSIS — T814XXD Infection following a procedure, subsequent encounter: Secondary | ICD-10-CM | POA: Diagnosis not present

## 2015-11-29 DIAGNOSIS — M6281 Muscle weakness (generalized): Secondary | ICD-10-CM | POA: Diagnosis not present

## 2015-11-29 DIAGNOSIS — M869 Osteomyelitis, unspecified: Secondary | ICD-10-CM | POA: Diagnosis not present

## 2015-11-29 DIAGNOSIS — N390 Urinary tract infection, site not specified: Secondary | ICD-10-CM | POA: Diagnosis not present

## 2015-12-02 DIAGNOSIS — M6281 Muscle weakness (generalized): Secondary | ICD-10-CM | POA: Diagnosis not present

## 2015-12-05 DIAGNOSIS — Z79899 Other long term (current) drug therapy: Secondary | ICD-10-CM | POA: Diagnosis not present

## 2015-12-05 DIAGNOSIS — Z961 Presence of intraocular lens: Secondary | ICD-10-CM | POA: Diagnosis not present

## 2015-12-05 DIAGNOSIS — H1851 Endothelial corneal dystrophy: Secondary | ICD-10-CM | POA: Diagnosis not present

## 2015-12-10 DIAGNOSIS — I739 Peripheral vascular disease, unspecified: Secondary | ICD-10-CM | POA: Diagnosis not present

## 2015-12-10 DIAGNOSIS — M79674 Pain in right toe(s): Secondary | ICD-10-CM | POA: Diagnosis not present

## 2015-12-10 DIAGNOSIS — I70203 Unspecified atherosclerosis of native arteries of extremities, bilateral legs: Secondary | ICD-10-CM | POA: Diagnosis not present

## 2015-12-10 DIAGNOSIS — B351 Tinea unguium: Secondary | ICD-10-CM | POA: Diagnosis not present

## 2015-12-30 DIAGNOSIS — M869 Osteomyelitis, unspecified: Secondary | ICD-10-CM | POA: Diagnosis not present

## 2015-12-30 DIAGNOSIS — Z792 Long term (current) use of antibiotics: Secondary | ICD-10-CM | POA: Diagnosis not present

## 2015-12-31 DIAGNOSIS — F419 Anxiety disorder, unspecified: Secondary | ICD-10-CM | POA: Diagnosis not present

## 2015-12-31 DIAGNOSIS — I6930 Unspecified sequelae of cerebral infarction: Secondary | ICD-10-CM | POA: Diagnosis not present

## 2015-12-31 DIAGNOSIS — G4700 Insomnia, unspecified: Secondary | ICD-10-CM | POA: Diagnosis not present

## 2015-12-31 DIAGNOSIS — M069 Rheumatoid arthritis, unspecified: Secondary | ICD-10-CM | POA: Diagnosis not present

## 2016-01-07 DIAGNOSIS — I6930 Unspecified sequelae of cerebral infarction: Secondary | ICD-10-CM | POA: Diagnosis not present

## 2016-01-07 DIAGNOSIS — M069 Rheumatoid arthritis, unspecified: Secondary | ICD-10-CM | POA: Diagnosis not present

## 2016-01-07 DIAGNOSIS — F419 Anxiety disorder, unspecified: Secondary | ICD-10-CM | POA: Diagnosis not present

## 2016-01-07 DIAGNOSIS — G4700 Insomnia, unspecified: Secondary | ICD-10-CM | POA: Diagnosis not present

## 2016-01-13 DIAGNOSIS — I350 Nonrheumatic aortic (valve) stenosis: Secondary | ICD-10-CM | POA: Diagnosis not present

## 2016-01-13 DIAGNOSIS — I25810 Atherosclerosis of coronary artery bypass graft(s) without angina pectoris: Secondary | ICD-10-CM | POA: Diagnosis not present

## 2016-01-13 DIAGNOSIS — I952 Hypotension due to drugs: Secondary | ICD-10-CM | POA: Diagnosis not present

## 2016-01-13 DIAGNOSIS — I6523 Occlusion and stenosis of bilateral carotid arteries: Secondary | ICD-10-CM | POA: Diagnosis not present

## 2016-01-21 DIAGNOSIS — M069 Rheumatoid arthritis, unspecified: Secondary | ICD-10-CM | POA: Diagnosis not present

## 2016-01-21 DIAGNOSIS — I6930 Unspecified sequelae of cerebral infarction: Secondary | ICD-10-CM | POA: Diagnosis not present

## 2016-01-21 DIAGNOSIS — G4700 Insomnia, unspecified: Secondary | ICD-10-CM | POA: Diagnosis not present

## 2016-01-21 DIAGNOSIS — F419 Anxiety disorder, unspecified: Secondary | ICD-10-CM | POA: Diagnosis not present

## 2016-01-31 DIAGNOSIS — I6350 Cerebral infarction due to unspecified occlusion or stenosis of unspecified cerebral artery: Secondary | ICD-10-CM | POA: Diagnosis not present

## 2016-01-31 DIAGNOSIS — M069 Rheumatoid arthritis, unspecified: Secondary | ICD-10-CM | POA: Diagnosis not present

## 2016-01-31 DIAGNOSIS — I10 Essential (primary) hypertension: Secondary | ICD-10-CM | POA: Diagnosis not present

## 2016-01-31 DIAGNOSIS — I2510 Atherosclerotic heart disease of native coronary artery without angina pectoris: Secondary | ICD-10-CM | POA: Diagnosis not present

## 2016-01-31 DIAGNOSIS — R350 Frequency of micturition: Secondary | ICD-10-CM | POA: Diagnosis not present

## 2016-01-31 DIAGNOSIS — E784 Other hyperlipidemia: Secondary | ICD-10-CM | POA: Diagnosis not present

## 2016-01-31 DIAGNOSIS — R110 Nausea: Secondary | ICD-10-CM | POA: Diagnosis not present

## 2016-01-31 DIAGNOSIS — I4891 Unspecified atrial fibrillation: Secondary | ICD-10-CM | POA: Diagnosis not present

## 2016-01-31 DIAGNOSIS — K5900 Constipation, unspecified: Secondary | ICD-10-CM | POA: Diagnosis not present

## 2016-02-01 DIAGNOSIS — R4182 Altered mental status, unspecified: Secondary | ICD-10-CM | POA: Diagnosis not present

## 2016-02-03 DIAGNOSIS — M810 Age-related osteoporosis without current pathological fracture: Secondary | ICD-10-CM | POA: Diagnosis not present

## 2016-02-03 DIAGNOSIS — M2550 Pain in unspecified joint: Secondary | ICD-10-CM | POA: Diagnosis not present

## 2016-02-03 DIAGNOSIS — M0579 Rheumatoid arthritis with rheumatoid factor of multiple sites without organ or systems involvement: Secondary | ICD-10-CM | POA: Diagnosis not present

## 2016-02-03 DIAGNOSIS — E785 Hyperlipidemia, unspecified: Secondary | ICD-10-CM | POA: Diagnosis not present

## 2016-02-03 DIAGNOSIS — M899 Disorder of bone, unspecified: Secondary | ICD-10-CM | POA: Diagnosis not present

## 2016-02-03 DIAGNOSIS — R4182 Altered mental status, unspecified: Secondary | ICD-10-CM | POA: Diagnosis not present

## 2016-02-11 DIAGNOSIS — T814XXD Infection following a procedure, subsequent encounter: Secondary | ICD-10-CM | POA: Diagnosis not present

## 2016-02-11 DIAGNOSIS — M869 Osteomyelitis, unspecified: Secondary | ICD-10-CM | POA: Diagnosis not present

## 2016-02-11 DIAGNOSIS — Z792 Long term (current) use of antibiotics: Secondary | ICD-10-CM | POA: Diagnosis not present

## 2016-02-11 DIAGNOSIS — L02213 Cutaneous abscess of chest wall: Secondary | ICD-10-CM | POA: Diagnosis not present

## 2016-02-11 DIAGNOSIS — M069 Rheumatoid arthritis, unspecified: Secondary | ICD-10-CM | POA: Diagnosis not present

## 2016-02-18 DIAGNOSIS — I6930 Unspecified sequelae of cerebral infarction: Secondary | ICD-10-CM | POA: Diagnosis not present

## 2016-02-18 DIAGNOSIS — G4700 Insomnia, unspecified: Secondary | ICD-10-CM | POA: Diagnosis not present

## 2016-02-18 DIAGNOSIS — M069 Rheumatoid arthritis, unspecified: Secondary | ICD-10-CM | POA: Diagnosis not present

## 2016-02-18 DIAGNOSIS — F419 Anxiety disorder, unspecified: Secondary | ICD-10-CM | POA: Diagnosis not present

## 2016-03-06 DIAGNOSIS — L02213 Cutaneous abscess of chest wall: Secondary | ICD-10-CM | POA: Diagnosis not present

## 2016-03-06 DIAGNOSIS — Z792 Long term (current) use of antibiotics: Secondary | ICD-10-CM | POA: Diagnosis not present

## 2016-03-06 DIAGNOSIS — M0579 Rheumatoid arthritis with rheumatoid factor of multiple sites without organ or systems involvement: Secondary | ICD-10-CM | POA: Diagnosis not present

## 2016-03-06 DIAGNOSIS — T814XXD Infection following a procedure, subsequent encounter: Secondary | ICD-10-CM | POA: Diagnosis not present

## 2016-03-08 DIAGNOSIS — S022XXA Fracture of nasal bones, initial encounter for closed fracture: Secondary | ICD-10-CM | POA: Diagnosis not present

## 2016-03-17 DIAGNOSIS — I350 Nonrheumatic aortic (valve) stenosis: Secondary | ICD-10-CM | POA: Diagnosis not present

## 2016-03-17 DIAGNOSIS — M6281 Muscle weakness (generalized): Secondary | ICD-10-CM | POA: Diagnosis not present

## 2016-03-17 DIAGNOSIS — I4891 Unspecified atrial fibrillation: Secondary | ICD-10-CM | POA: Diagnosis not present

## 2016-03-17 DIAGNOSIS — I214 Non-ST elevation (NSTEMI) myocardial infarction: Secondary | ICD-10-CM | POA: Diagnosis not present

## 2016-03-17 DIAGNOSIS — R296 Repeated falls: Secondary | ICD-10-CM | POA: Diagnosis not present

## 2016-03-17 DIAGNOSIS — I517 Cardiomegaly: Secondary | ICD-10-CM | POA: Diagnosis not present

## 2016-03-17 DIAGNOSIS — I639 Cerebral infarction, unspecified: Secondary | ICD-10-CM | POA: Diagnosis not present

## 2016-03-17 DIAGNOSIS — I2510 Atherosclerotic heart disease of native coronary artery without angina pectoris: Secondary | ICD-10-CM | POA: Diagnosis not present

## 2016-03-18 DIAGNOSIS — M6281 Muscle weakness (generalized): Secondary | ICD-10-CM | POA: Diagnosis not present

## 2016-03-18 DIAGNOSIS — I2510 Atherosclerotic heart disease of native coronary artery without angina pectoris: Secondary | ICD-10-CM | POA: Diagnosis not present

## 2016-03-18 DIAGNOSIS — I639 Cerebral infarction, unspecified: Secondary | ICD-10-CM | POA: Diagnosis not present

## 2016-03-18 DIAGNOSIS — I517 Cardiomegaly: Secondary | ICD-10-CM | POA: Diagnosis not present

## 2016-03-18 DIAGNOSIS — R296 Repeated falls: Secondary | ICD-10-CM | POA: Diagnosis not present

## 2016-03-18 DIAGNOSIS — I214 Non-ST elevation (NSTEMI) myocardial infarction: Secondary | ICD-10-CM | POA: Diagnosis not present

## 2016-03-19 DIAGNOSIS — I214 Non-ST elevation (NSTEMI) myocardial infarction: Secondary | ICD-10-CM | POA: Diagnosis not present

## 2016-03-19 DIAGNOSIS — M6281 Muscle weakness (generalized): Secondary | ICD-10-CM | POA: Diagnosis not present

## 2016-03-19 DIAGNOSIS — I2510 Atherosclerotic heart disease of native coronary artery without angina pectoris: Secondary | ICD-10-CM | POA: Diagnosis not present

## 2016-03-19 DIAGNOSIS — I517 Cardiomegaly: Secondary | ICD-10-CM | POA: Diagnosis not present

## 2016-03-19 DIAGNOSIS — I639 Cerebral infarction, unspecified: Secondary | ICD-10-CM | POA: Diagnosis not present

## 2016-03-19 DIAGNOSIS — R296 Repeated falls: Secondary | ICD-10-CM | POA: Diagnosis not present

## 2016-03-20 DIAGNOSIS — I214 Non-ST elevation (NSTEMI) myocardial infarction: Secondary | ICD-10-CM | POA: Diagnosis not present

## 2016-03-20 DIAGNOSIS — I639 Cerebral infarction, unspecified: Secondary | ICD-10-CM | POA: Diagnosis not present

## 2016-03-20 DIAGNOSIS — M6281 Muscle weakness (generalized): Secondary | ICD-10-CM | POA: Diagnosis not present

## 2016-03-20 DIAGNOSIS — I2510 Atherosclerotic heart disease of native coronary artery without angina pectoris: Secondary | ICD-10-CM | POA: Diagnosis not present

## 2016-03-20 DIAGNOSIS — I517 Cardiomegaly: Secondary | ICD-10-CM | POA: Diagnosis not present

## 2016-03-20 DIAGNOSIS — R296 Repeated falls: Secondary | ICD-10-CM | POA: Diagnosis not present

## 2016-03-23 DIAGNOSIS — I214 Non-ST elevation (NSTEMI) myocardial infarction: Secondary | ICD-10-CM | POA: Diagnosis not present

## 2016-03-23 DIAGNOSIS — R296 Repeated falls: Secondary | ICD-10-CM | POA: Diagnosis not present

## 2016-03-23 DIAGNOSIS — I517 Cardiomegaly: Secondary | ICD-10-CM | POA: Diagnosis not present

## 2016-03-23 DIAGNOSIS — M6281 Muscle weakness (generalized): Secondary | ICD-10-CM | POA: Diagnosis not present

## 2016-03-23 DIAGNOSIS — I639 Cerebral infarction, unspecified: Secondary | ICD-10-CM | POA: Diagnosis not present

## 2016-03-23 DIAGNOSIS — I2510 Atherosclerotic heart disease of native coronary artery without angina pectoris: Secondary | ICD-10-CM | POA: Diagnosis not present

## 2016-03-24 DIAGNOSIS — I2510 Atherosclerotic heart disease of native coronary artery without angina pectoris: Secondary | ICD-10-CM | POA: Diagnosis not present

## 2016-03-24 DIAGNOSIS — I639 Cerebral infarction, unspecified: Secondary | ICD-10-CM | POA: Diagnosis not present

## 2016-03-24 DIAGNOSIS — I517 Cardiomegaly: Secondary | ICD-10-CM | POA: Diagnosis not present

## 2016-03-24 DIAGNOSIS — R296 Repeated falls: Secondary | ICD-10-CM | POA: Diagnosis not present

## 2016-03-24 DIAGNOSIS — I214 Non-ST elevation (NSTEMI) myocardial infarction: Secondary | ICD-10-CM | POA: Diagnosis not present

## 2016-03-24 DIAGNOSIS — M6281 Muscle weakness (generalized): Secondary | ICD-10-CM | POA: Diagnosis not present

## 2016-03-25 DIAGNOSIS — I517 Cardiomegaly: Secondary | ICD-10-CM | POA: Diagnosis not present

## 2016-03-25 DIAGNOSIS — I639 Cerebral infarction, unspecified: Secondary | ICD-10-CM | POA: Diagnosis not present

## 2016-03-25 DIAGNOSIS — R296 Repeated falls: Secondary | ICD-10-CM | POA: Diagnosis not present

## 2016-03-25 DIAGNOSIS — I214 Non-ST elevation (NSTEMI) myocardial infarction: Secondary | ICD-10-CM | POA: Diagnosis not present

## 2016-03-25 DIAGNOSIS — M6281 Muscle weakness (generalized): Secondary | ICD-10-CM | POA: Diagnosis not present

## 2016-03-25 DIAGNOSIS — I2510 Atherosclerotic heart disease of native coronary artery without angina pectoris: Secondary | ICD-10-CM | POA: Diagnosis not present

## 2016-03-26 DIAGNOSIS — R296 Repeated falls: Secondary | ICD-10-CM | POA: Diagnosis not present

## 2016-03-26 DIAGNOSIS — I4891 Unspecified atrial fibrillation: Secondary | ICD-10-CM | POA: Diagnosis not present

## 2016-03-26 DIAGNOSIS — I639 Cerebral infarction, unspecified: Secondary | ICD-10-CM | POA: Diagnosis not present

## 2016-03-26 DIAGNOSIS — I214 Non-ST elevation (NSTEMI) myocardial infarction: Secondary | ICD-10-CM | POA: Diagnosis not present

## 2016-03-26 DIAGNOSIS — M6281 Muscle weakness (generalized): Secondary | ICD-10-CM | POA: Diagnosis not present

## 2016-03-26 DIAGNOSIS — I2510 Atherosclerotic heart disease of native coronary artery without angina pectoris: Secondary | ICD-10-CM | POA: Diagnosis not present

## 2016-03-26 DIAGNOSIS — I517 Cardiomegaly: Secondary | ICD-10-CM | POA: Diagnosis not present

## 2016-03-26 DIAGNOSIS — I350 Nonrheumatic aortic (valve) stenosis: Secondary | ICD-10-CM | POA: Diagnosis not present

## 2016-03-27 DIAGNOSIS — R296 Repeated falls: Secondary | ICD-10-CM | POA: Diagnosis not present

## 2016-03-27 DIAGNOSIS — I639 Cerebral infarction, unspecified: Secondary | ICD-10-CM | POA: Diagnosis not present

## 2016-03-27 DIAGNOSIS — I214 Non-ST elevation (NSTEMI) myocardial infarction: Secondary | ICD-10-CM | POA: Diagnosis not present

## 2016-03-27 DIAGNOSIS — M6281 Muscle weakness (generalized): Secondary | ICD-10-CM | POA: Diagnosis not present

## 2016-03-27 DIAGNOSIS — I517 Cardiomegaly: Secondary | ICD-10-CM | POA: Diagnosis not present

## 2016-03-27 DIAGNOSIS — I2510 Atherosclerotic heart disease of native coronary artery without angina pectoris: Secondary | ICD-10-CM | POA: Diagnosis not present

## 2016-03-28 DIAGNOSIS — I2510 Atherosclerotic heart disease of native coronary artery without angina pectoris: Secondary | ICD-10-CM | POA: Diagnosis not present

## 2016-03-28 DIAGNOSIS — D649 Anemia, unspecified: Secondary | ICD-10-CM | POA: Diagnosis not present

## 2016-03-28 DIAGNOSIS — I10 Essential (primary) hypertension: Secondary | ICD-10-CM | POA: Diagnosis not present

## 2016-03-28 DIAGNOSIS — I4891 Unspecified atrial fibrillation: Secondary | ICD-10-CM | POA: Diagnosis not present

## 2016-03-28 DIAGNOSIS — E785 Hyperlipidemia, unspecified: Secondary | ICD-10-CM | POA: Diagnosis not present

## 2016-03-28 DIAGNOSIS — I6350 Cerebral infarction due to unspecified occlusion or stenosis of unspecified cerebral artery: Secondary | ICD-10-CM | POA: Diagnosis not present

## 2016-03-28 DIAGNOSIS — K5900 Constipation, unspecified: Secondary | ICD-10-CM | POA: Diagnosis not present

## 2016-03-28 DIAGNOSIS — M0579 Rheumatoid arthritis with rheumatoid factor of multiple sites without organ or systems involvement: Secondary | ICD-10-CM | POA: Diagnosis not present

## 2016-03-30 DIAGNOSIS — I639 Cerebral infarction, unspecified: Secondary | ICD-10-CM | POA: Diagnosis not present

## 2016-03-30 DIAGNOSIS — R296 Repeated falls: Secondary | ICD-10-CM | POA: Diagnosis not present

## 2016-03-30 DIAGNOSIS — I517 Cardiomegaly: Secondary | ICD-10-CM | POA: Diagnosis not present

## 2016-03-30 DIAGNOSIS — I214 Non-ST elevation (NSTEMI) myocardial infarction: Secondary | ICD-10-CM | POA: Diagnosis not present

## 2016-03-30 DIAGNOSIS — I2510 Atherosclerotic heart disease of native coronary artery without angina pectoris: Secondary | ICD-10-CM | POA: Diagnosis not present

## 2016-03-30 DIAGNOSIS — M6281 Muscle weakness (generalized): Secondary | ICD-10-CM | POA: Diagnosis not present

## 2016-03-31 DIAGNOSIS — I6930 Unspecified sequelae of cerebral infarction: Secondary | ICD-10-CM | POA: Diagnosis not present

## 2016-03-31 DIAGNOSIS — I517 Cardiomegaly: Secondary | ICD-10-CM | POA: Diagnosis not present

## 2016-03-31 DIAGNOSIS — G4700 Insomnia, unspecified: Secondary | ICD-10-CM | POA: Diagnosis not present

## 2016-03-31 DIAGNOSIS — M069 Rheumatoid arthritis, unspecified: Secondary | ICD-10-CM | POA: Diagnosis not present

## 2016-03-31 DIAGNOSIS — F419 Anxiety disorder, unspecified: Secondary | ICD-10-CM | POA: Diagnosis not present

## 2016-03-31 DIAGNOSIS — I2510 Atherosclerotic heart disease of native coronary artery without angina pectoris: Secondary | ICD-10-CM | POA: Diagnosis not present

## 2016-03-31 DIAGNOSIS — I214 Non-ST elevation (NSTEMI) myocardial infarction: Secondary | ICD-10-CM | POA: Diagnosis not present

## 2016-03-31 DIAGNOSIS — I639 Cerebral infarction, unspecified: Secondary | ICD-10-CM | POA: Diagnosis not present

## 2016-03-31 DIAGNOSIS — E785 Hyperlipidemia, unspecified: Secondary | ICD-10-CM | POA: Diagnosis not present

## 2016-03-31 DIAGNOSIS — R296 Repeated falls: Secondary | ICD-10-CM | POA: Diagnosis not present

## 2016-03-31 DIAGNOSIS — M6281 Muscle weakness (generalized): Secondary | ICD-10-CM | POA: Diagnosis not present

## 2016-04-01 DIAGNOSIS — I214 Non-ST elevation (NSTEMI) myocardial infarction: Secondary | ICD-10-CM | POA: Diagnosis not present

## 2016-04-01 DIAGNOSIS — I639 Cerebral infarction, unspecified: Secondary | ICD-10-CM | POA: Diagnosis not present

## 2016-04-01 DIAGNOSIS — I2510 Atherosclerotic heart disease of native coronary artery without angina pectoris: Secondary | ICD-10-CM | POA: Diagnosis not present

## 2016-04-01 DIAGNOSIS — R296 Repeated falls: Secondary | ICD-10-CM | POA: Diagnosis not present

## 2016-04-01 DIAGNOSIS — M6281 Muscle weakness (generalized): Secondary | ICD-10-CM | POA: Diagnosis not present

## 2016-04-01 DIAGNOSIS — I517 Cardiomegaly: Secondary | ICD-10-CM | POA: Diagnosis not present

## 2016-04-02 DIAGNOSIS — I214 Non-ST elevation (NSTEMI) myocardial infarction: Secondary | ICD-10-CM | POA: Diagnosis not present

## 2016-04-02 DIAGNOSIS — R296 Repeated falls: Secondary | ICD-10-CM | POA: Diagnosis not present

## 2016-04-02 DIAGNOSIS — I517 Cardiomegaly: Secondary | ICD-10-CM | POA: Diagnosis not present

## 2016-04-02 DIAGNOSIS — I2510 Atherosclerotic heart disease of native coronary artery without angina pectoris: Secondary | ICD-10-CM | POA: Diagnosis not present

## 2016-04-02 DIAGNOSIS — I639 Cerebral infarction, unspecified: Secondary | ICD-10-CM | POA: Diagnosis not present

## 2016-04-02 DIAGNOSIS — M6281 Muscle weakness (generalized): Secondary | ICD-10-CM | POA: Diagnosis not present

## 2016-04-03 DIAGNOSIS — M869 Osteomyelitis, unspecified: Secondary | ICD-10-CM | POA: Diagnosis not present

## 2016-04-03 DIAGNOSIS — R296 Repeated falls: Secondary | ICD-10-CM | POA: Diagnosis not present

## 2016-04-03 DIAGNOSIS — Z792 Long term (current) use of antibiotics: Secondary | ICD-10-CM | POA: Diagnosis not present

## 2016-04-03 DIAGNOSIS — M6281 Muscle weakness (generalized): Secondary | ICD-10-CM | POA: Diagnosis not present

## 2016-04-03 DIAGNOSIS — I2510 Atherosclerotic heart disease of native coronary artery without angina pectoris: Secondary | ICD-10-CM | POA: Diagnosis not present

## 2016-04-03 DIAGNOSIS — I639 Cerebral infarction, unspecified: Secondary | ICD-10-CM | POA: Diagnosis not present

## 2016-04-03 DIAGNOSIS — I517 Cardiomegaly: Secondary | ICD-10-CM | POA: Diagnosis not present

## 2016-04-03 DIAGNOSIS — I214 Non-ST elevation (NSTEMI) myocardial infarction: Secondary | ICD-10-CM | POA: Diagnosis not present

## 2016-04-06 DIAGNOSIS — I2510 Atherosclerotic heart disease of native coronary artery without angina pectoris: Secondary | ICD-10-CM | POA: Diagnosis not present

## 2016-04-06 DIAGNOSIS — M6281 Muscle weakness (generalized): Secondary | ICD-10-CM | POA: Diagnosis not present

## 2016-04-06 DIAGNOSIS — I214 Non-ST elevation (NSTEMI) myocardial infarction: Secondary | ICD-10-CM | POA: Diagnosis not present

## 2016-04-06 DIAGNOSIS — I639 Cerebral infarction, unspecified: Secondary | ICD-10-CM | POA: Diagnosis not present

## 2016-04-06 DIAGNOSIS — R296 Repeated falls: Secondary | ICD-10-CM | POA: Diagnosis not present

## 2016-04-06 DIAGNOSIS — I517 Cardiomegaly: Secondary | ICD-10-CM | POA: Diagnosis not present

## 2016-04-07 DIAGNOSIS — I517 Cardiomegaly: Secondary | ICD-10-CM | POA: Diagnosis not present

## 2016-04-07 DIAGNOSIS — I214 Non-ST elevation (NSTEMI) myocardial infarction: Secondary | ICD-10-CM | POA: Diagnosis not present

## 2016-04-07 DIAGNOSIS — I639 Cerebral infarction, unspecified: Secondary | ICD-10-CM | POA: Diagnosis not present

## 2016-04-07 DIAGNOSIS — R296 Repeated falls: Secondary | ICD-10-CM | POA: Diagnosis not present

## 2016-04-07 DIAGNOSIS — I2510 Atherosclerotic heart disease of native coronary artery without angina pectoris: Secondary | ICD-10-CM | POA: Diagnosis not present

## 2016-04-07 DIAGNOSIS — M6281 Muscle weakness (generalized): Secondary | ICD-10-CM | POA: Diagnosis not present

## 2016-04-08 DIAGNOSIS — I639 Cerebral infarction, unspecified: Secondary | ICD-10-CM | POA: Diagnosis not present

## 2016-04-08 DIAGNOSIS — I214 Non-ST elevation (NSTEMI) myocardial infarction: Secondary | ICD-10-CM | POA: Diagnosis not present

## 2016-04-08 DIAGNOSIS — I517 Cardiomegaly: Secondary | ICD-10-CM | POA: Diagnosis not present

## 2016-04-08 DIAGNOSIS — I2510 Atherosclerotic heart disease of native coronary artery without angina pectoris: Secondary | ICD-10-CM | POA: Diagnosis not present

## 2016-04-08 DIAGNOSIS — R296 Repeated falls: Secondary | ICD-10-CM | POA: Diagnosis not present

## 2016-04-08 DIAGNOSIS — M6281 Muscle weakness (generalized): Secondary | ICD-10-CM | POA: Diagnosis not present

## 2016-04-09 DIAGNOSIS — M6281 Muscle weakness (generalized): Secondary | ICD-10-CM | POA: Diagnosis not present

## 2016-04-09 DIAGNOSIS — R296 Repeated falls: Secondary | ICD-10-CM | POA: Diagnosis not present

## 2016-04-09 DIAGNOSIS — I2510 Atherosclerotic heart disease of native coronary artery without angina pectoris: Secondary | ICD-10-CM | POA: Diagnosis not present

## 2016-04-09 DIAGNOSIS — I517 Cardiomegaly: Secondary | ICD-10-CM | POA: Diagnosis not present

## 2016-04-09 DIAGNOSIS — I639 Cerebral infarction, unspecified: Secondary | ICD-10-CM | POA: Diagnosis not present

## 2016-04-09 DIAGNOSIS — I214 Non-ST elevation (NSTEMI) myocardial infarction: Secondary | ICD-10-CM | POA: Diagnosis not present

## 2016-04-10 DIAGNOSIS — I639 Cerebral infarction, unspecified: Secondary | ICD-10-CM | POA: Diagnosis not present

## 2016-04-10 DIAGNOSIS — R296 Repeated falls: Secondary | ICD-10-CM | POA: Diagnosis not present

## 2016-04-10 DIAGNOSIS — M6281 Muscle weakness (generalized): Secondary | ICD-10-CM | POA: Diagnosis not present

## 2016-04-10 DIAGNOSIS — I517 Cardiomegaly: Secondary | ICD-10-CM | POA: Diagnosis not present

## 2016-04-10 DIAGNOSIS — I2510 Atherosclerotic heart disease of native coronary artery without angina pectoris: Secondary | ICD-10-CM | POA: Diagnosis not present

## 2016-04-10 DIAGNOSIS — I214 Non-ST elevation (NSTEMI) myocardial infarction: Secondary | ICD-10-CM | POA: Diagnosis not present

## 2016-04-13 DIAGNOSIS — I2510 Atherosclerotic heart disease of native coronary artery without angina pectoris: Secondary | ICD-10-CM | POA: Diagnosis not present

## 2016-04-13 DIAGNOSIS — I639 Cerebral infarction, unspecified: Secondary | ICD-10-CM | POA: Diagnosis not present

## 2016-04-13 DIAGNOSIS — R296 Repeated falls: Secondary | ICD-10-CM | POA: Diagnosis not present

## 2016-04-13 DIAGNOSIS — I214 Non-ST elevation (NSTEMI) myocardial infarction: Secondary | ICD-10-CM | POA: Diagnosis not present

## 2016-04-13 DIAGNOSIS — I517 Cardiomegaly: Secondary | ICD-10-CM | POA: Diagnosis not present

## 2016-04-13 DIAGNOSIS — M6281 Muscle weakness (generalized): Secondary | ICD-10-CM | POA: Diagnosis not present

## 2016-04-14 DIAGNOSIS — M79675 Pain in left toe(s): Secondary | ICD-10-CM | POA: Diagnosis not present

## 2016-04-14 DIAGNOSIS — G608 Other hereditary and idiopathic neuropathies: Secondary | ICD-10-CM | POA: Diagnosis not present

## 2016-04-14 DIAGNOSIS — B351 Tinea unguium: Secondary | ICD-10-CM | POA: Diagnosis not present

## 2016-05-04 DIAGNOSIS — R102 Pelvic and perineal pain: Secondary | ICD-10-CM | POA: Diagnosis not present

## 2016-05-04 DIAGNOSIS — M810 Age-related osteoporosis without current pathological fracture: Secondary | ICD-10-CM | POA: Diagnosis not present

## 2016-05-04 DIAGNOSIS — Z792 Long term (current) use of antibiotics: Secondary | ICD-10-CM | POA: Diagnosis not present

## 2016-05-04 DIAGNOSIS — M899 Disorder of bone, unspecified: Secondary | ICD-10-CM | POA: Diagnosis not present

## 2016-05-04 DIAGNOSIS — T814XXD Infection following a procedure, subsequent encounter: Secondary | ICD-10-CM | POA: Diagnosis not present

## 2016-05-04 DIAGNOSIS — M79651 Pain in right thigh: Secondary | ICD-10-CM | POA: Diagnosis not present

## 2016-05-04 DIAGNOSIS — M0579 Rheumatoid arthritis with rheumatoid factor of multiple sites without organ or systems involvement: Secondary | ICD-10-CM | POA: Diagnosis not present

## 2016-05-04 DIAGNOSIS — L02213 Cutaneous abscess of chest wall: Secondary | ICD-10-CM | POA: Diagnosis not present

## 2016-05-11 DIAGNOSIS — I6930 Unspecified sequelae of cerebral infarction: Secondary | ICD-10-CM | POA: Diagnosis not present

## 2016-05-11 DIAGNOSIS — G4700 Insomnia, unspecified: Secondary | ICD-10-CM | POA: Diagnosis not present

## 2016-05-11 DIAGNOSIS — F419 Anxiety disorder, unspecified: Secondary | ICD-10-CM | POA: Diagnosis not present

## 2016-05-11 DIAGNOSIS — M069 Rheumatoid arthritis, unspecified: Secondary | ICD-10-CM | POA: Diagnosis not present

## 2016-05-28 DIAGNOSIS — M0579 Rheumatoid arthritis with rheumatoid factor of multiple sites without organ or systems involvement: Secondary | ICD-10-CM | POA: Diagnosis not present

## 2016-05-28 DIAGNOSIS — I4891 Unspecified atrial fibrillation: Secondary | ICD-10-CM | POA: Diagnosis not present

## 2016-05-28 DIAGNOSIS — F419 Anxiety disorder, unspecified: Secondary | ICD-10-CM | POA: Diagnosis not present

## 2016-05-28 DIAGNOSIS — M79604 Pain in right leg: Secondary | ICD-10-CM | POA: Diagnosis not present

## 2016-05-28 DIAGNOSIS — E785 Hyperlipidemia, unspecified: Secondary | ICD-10-CM | POA: Diagnosis not present

## 2016-05-28 DIAGNOSIS — I10 Essential (primary) hypertension: Secondary | ICD-10-CM | POA: Diagnosis not present

## 2016-05-28 DIAGNOSIS — Z8673 Personal history of transient ischemic attack (TIA), and cerebral infarction without residual deficits: Secondary | ICD-10-CM | POA: Diagnosis not present

## 2016-05-28 DIAGNOSIS — F329 Major depressive disorder, single episode, unspecified: Secondary | ICD-10-CM | POA: Diagnosis not present

## 2016-06-04 DIAGNOSIS — R262 Difficulty in walking, not elsewhere classified: Secondary | ICD-10-CM | POA: Diagnosis not present

## 2016-06-04 DIAGNOSIS — I639 Cerebral infarction, unspecified: Secondary | ICD-10-CM | POA: Diagnosis not present

## 2016-06-04 DIAGNOSIS — I2510 Atherosclerotic heart disease of native coronary artery without angina pectoris: Secondary | ICD-10-CM | POA: Diagnosis not present

## 2016-06-04 DIAGNOSIS — I4891 Unspecified atrial fibrillation: Secondary | ICD-10-CM | POA: Diagnosis not present

## 2016-06-04 DIAGNOSIS — I214 Non-ST elevation (NSTEMI) myocardial infarction: Secondary | ICD-10-CM | POA: Diagnosis not present

## 2016-06-04 DIAGNOSIS — M6281 Muscle weakness (generalized): Secondary | ICD-10-CM | POA: Diagnosis not present

## 2016-06-04 DIAGNOSIS — I517 Cardiomegaly: Secondary | ICD-10-CM | POA: Diagnosis not present

## 2016-06-04 DIAGNOSIS — G8911 Acute pain due to trauma: Secondary | ICD-10-CM | POA: Diagnosis not present

## 2016-06-04 DIAGNOSIS — I350 Nonrheumatic aortic (valve) stenosis: Secondary | ICD-10-CM | POA: Diagnosis not present

## 2016-06-05 DIAGNOSIS — I639 Cerebral infarction, unspecified: Secondary | ICD-10-CM | POA: Diagnosis not present

## 2016-06-05 DIAGNOSIS — I517 Cardiomegaly: Secondary | ICD-10-CM | POA: Diagnosis not present

## 2016-06-05 DIAGNOSIS — I214 Non-ST elevation (NSTEMI) myocardial infarction: Secondary | ICD-10-CM | POA: Diagnosis not present

## 2016-06-05 DIAGNOSIS — M6281 Muscle weakness (generalized): Secondary | ICD-10-CM | POA: Diagnosis not present

## 2016-06-05 DIAGNOSIS — G8911 Acute pain due to trauma: Secondary | ICD-10-CM | POA: Diagnosis not present

## 2016-06-05 DIAGNOSIS — R262 Difficulty in walking, not elsewhere classified: Secondary | ICD-10-CM | POA: Diagnosis not present

## 2016-06-08 DIAGNOSIS — I517 Cardiomegaly: Secondary | ICD-10-CM | POA: Diagnosis not present

## 2016-06-08 DIAGNOSIS — G8911 Acute pain due to trauma: Secondary | ICD-10-CM | POA: Diagnosis not present

## 2016-06-08 DIAGNOSIS — M6281 Muscle weakness (generalized): Secondary | ICD-10-CM | POA: Diagnosis not present

## 2016-06-08 DIAGNOSIS — R262 Difficulty in walking, not elsewhere classified: Secondary | ICD-10-CM | POA: Diagnosis not present

## 2016-06-08 DIAGNOSIS — I214 Non-ST elevation (NSTEMI) myocardial infarction: Secondary | ICD-10-CM | POA: Diagnosis not present

## 2016-06-08 DIAGNOSIS — I639 Cerebral infarction, unspecified: Secondary | ICD-10-CM | POA: Diagnosis not present

## 2016-06-09 DIAGNOSIS — I214 Non-ST elevation (NSTEMI) myocardial infarction: Secondary | ICD-10-CM | POA: Diagnosis not present

## 2016-06-09 DIAGNOSIS — R262 Difficulty in walking, not elsewhere classified: Secondary | ICD-10-CM | POA: Diagnosis not present

## 2016-06-09 DIAGNOSIS — G8911 Acute pain due to trauma: Secondary | ICD-10-CM | POA: Diagnosis not present

## 2016-06-09 DIAGNOSIS — I639 Cerebral infarction, unspecified: Secondary | ICD-10-CM | POA: Diagnosis not present

## 2016-06-09 DIAGNOSIS — M6281 Muscle weakness (generalized): Secondary | ICD-10-CM | POA: Diagnosis not present

## 2016-06-09 DIAGNOSIS — I517 Cardiomegaly: Secondary | ICD-10-CM | POA: Diagnosis not present

## 2016-06-10 DIAGNOSIS — I639 Cerebral infarction, unspecified: Secondary | ICD-10-CM | POA: Diagnosis not present

## 2016-06-10 DIAGNOSIS — M6281 Muscle weakness (generalized): Secondary | ICD-10-CM | POA: Diagnosis not present

## 2016-06-10 DIAGNOSIS — R262 Difficulty in walking, not elsewhere classified: Secondary | ICD-10-CM | POA: Diagnosis not present

## 2016-06-10 DIAGNOSIS — I214 Non-ST elevation (NSTEMI) myocardial infarction: Secondary | ICD-10-CM | POA: Diagnosis not present

## 2016-06-10 DIAGNOSIS — G8911 Acute pain due to trauma: Secondary | ICD-10-CM | POA: Diagnosis not present

## 2016-06-10 DIAGNOSIS — I517 Cardiomegaly: Secondary | ICD-10-CM | POA: Diagnosis not present

## 2016-06-11 DIAGNOSIS — G8911 Acute pain due to trauma: Secondary | ICD-10-CM | POA: Diagnosis not present

## 2016-06-11 DIAGNOSIS — R262 Difficulty in walking, not elsewhere classified: Secondary | ICD-10-CM | POA: Diagnosis not present

## 2016-06-11 DIAGNOSIS — I214 Non-ST elevation (NSTEMI) myocardial infarction: Secondary | ICD-10-CM | POA: Diagnosis not present

## 2016-06-11 DIAGNOSIS — I639 Cerebral infarction, unspecified: Secondary | ICD-10-CM | POA: Diagnosis not present

## 2016-06-11 DIAGNOSIS — M6281 Muscle weakness (generalized): Secondary | ICD-10-CM | POA: Diagnosis not present

## 2016-06-11 DIAGNOSIS — I517 Cardiomegaly: Secondary | ICD-10-CM | POA: Diagnosis not present

## 2016-06-12 DIAGNOSIS — I517 Cardiomegaly: Secondary | ICD-10-CM | POA: Diagnosis not present

## 2016-06-12 DIAGNOSIS — R262 Difficulty in walking, not elsewhere classified: Secondary | ICD-10-CM | POA: Diagnosis not present

## 2016-06-12 DIAGNOSIS — I639 Cerebral infarction, unspecified: Secondary | ICD-10-CM | POA: Diagnosis not present

## 2016-06-12 DIAGNOSIS — G8911 Acute pain due to trauma: Secondary | ICD-10-CM | POA: Diagnosis not present

## 2016-06-12 DIAGNOSIS — M6281 Muscle weakness (generalized): Secondary | ICD-10-CM | POA: Diagnosis not present

## 2016-06-12 DIAGNOSIS — I214 Non-ST elevation (NSTEMI) myocardial infarction: Secondary | ICD-10-CM | POA: Diagnosis not present

## 2016-06-15 DIAGNOSIS — I639 Cerebral infarction, unspecified: Secondary | ICD-10-CM | POA: Diagnosis not present

## 2016-06-15 DIAGNOSIS — R262 Difficulty in walking, not elsewhere classified: Secondary | ICD-10-CM | POA: Diagnosis not present

## 2016-06-15 DIAGNOSIS — M6281 Muscle weakness (generalized): Secondary | ICD-10-CM | POA: Diagnosis not present

## 2016-06-15 DIAGNOSIS — I517 Cardiomegaly: Secondary | ICD-10-CM | POA: Diagnosis not present

## 2016-06-15 DIAGNOSIS — I214 Non-ST elevation (NSTEMI) myocardial infarction: Secondary | ICD-10-CM | POA: Diagnosis not present

## 2016-06-15 DIAGNOSIS — G8911 Acute pain due to trauma: Secondary | ICD-10-CM | POA: Diagnosis not present

## 2016-06-16 DIAGNOSIS — I639 Cerebral infarction, unspecified: Secondary | ICD-10-CM | POA: Diagnosis not present

## 2016-06-16 DIAGNOSIS — G8911 Acute pain due to trauma: Secondary | ICD-10-CM | POA: Diagnosis not present

## 2016-06-16 DIAGNOSIS — I517 Cardiomegaly: Secondary | ICD-10-CM | POA: Diagnosis not present

## 2016-06-16 DIAGNOSIS — R262 Difficulty in walking, not elsewhere classified: Secondary | ICD-10-CM | POA: Diagnosis not present

## 2016-06-16 DIAGNOSIS — I214 Non-ST elevation (NSTEMI) myocardial infarction: Secondary | ICD-10-CM | POA: Diagnosis not present

## 2016-06-16 DIAGNOSIS — M6281 Muscle weakness (generalized): Secondary | ICD-10-CM | POA: Diagnosis not present

## 2016-06-17 DIAGNOSIS — R262 Difficulty in walking, not elsewhere classified: Secondary | ICD-10-CM | POA: Diagnosis not present

## 2016-06-17 DIAGNOSIS — I639 Cerebral infarction, unspecified: Secondary | ICD-10-CM | POA: Diagnosis not present

## 2016-06-17 DIAGNOSIS — G8911 Acute pain due to trauma: Secondary | ICD-10-CM | POA: Diagnosis not present

## 2016-06-17 DIAGNOSIS — I214 Non-ST elevation (NSTEMI) myocardial infarction: Secondary | ICD-10-CM | POA: Diagnosis not present

## 2016-06-17 DIAGNOSIS — I517 Cardiomegaly: Secondary | ICD-10-CM | POA: Diagnosis not present

## 2016-06-17 DIAGNOSIS — M6281 Muscle weakness (generalized): Secondary | ICD-10-CM | POA: Diagnosis not present

## 2016-06-18 DIAGNOSIS — I214 Non-ST elevation (NSTEMI) myocardial infarction: Secondary | ICD-10-CM | POA: Diagnosis not present

## 2016-06-18 DIAGNOSIS — G8911 Acute pain due to trauma: Secondary | ICD-10-CM | POA: Diagnosis not present

## 2016-06-18 DIAGNOSIS — I639 Cerebral infarction, unspecified: Secondary | ICD-10-CM | POA: Diagnosis not present

## 2016-06-18 DIAGNOSIS — R262 Difficulty in walking, not elsewhere classified: Secondary | ICD-10-CM | POA: Diagnosis not present

## 2016-06-18 DIAGNOSIS — M6281 Muscle weakness (generalized): Secondary | ICD-10-CM | POA: Diagnosis not present

## 2016-06-18 DIAGNOSIS — I517 Cardiomegaly: Secondary | ICD-10-CM | POA: Diagnosis not present

## 2016-06-19 DIAGNOSIS — M6281 Muscle weakness (generalized): Secondary | ICD-10-CM | POA: Diagnosis not present

## 2016-06-19 DIAGNOSIS — I517 Cardiomegaly: Secondary | ICD-10-CM | POA: Diagnosis not present

## 2016-06-19 DIAGNOSIS — I639 Cerebral infarction, unspecified: Secondary | ICD-10-CM | POA: Diagnosis not present

## 2016-06-19 DIAGNOSIS — I214 Non-ST elevation (NSTEMI) myocardial infarction: Secondary | ICD-10-CM | POA: Diagnosis not present

## 2016-06-19 DIAGNOSIS — R262 Difficulty in walking, not elsewhere classified: Secondary | ICD-10-CM | POA: Diagnosis not present

## 2016-06-19 DIAGNOSIS — G8911 Acute pain due to trauma: Secondary | ICD-10-CM | POA: Diagnosis not present

## 2016-06-22 DIAGNOSIS — I517 Cardiomegaly: Secondary | ICD-10-CM | POA: Diagnosis not present

## 2016-06-22 DIAGNOSIS — G8911 Acute pain due to trauma: Secondary | ICD-10-CM | POA: Diagnosis not present

## 2016-06-22 DIAGNOSIS — I214 Non-ST elevation (NSTEMI) myocardial infarction: Secondary | ICD-10-CM | POA: Diagnosis not present

## 2016-06-22 DIAGNOSIS — I639 Cerebral infarction, unspecified: Secondary | ICD-10-CM | POA: Diagnosis not present

## 2016-06-22 DIAGNOSIS — M6281 Muscle weakness (generalized): Secondary | ICD-10-CM | POA: Diagnosis not present

## 2016-06-22 DIAGNOSIS — R262 Difficulty in walking, not elsewhere classified: Secondary | ICD-10-CM | POA: Diagnosis not present

## 2016-06-23 DIAGNOSIS — I639 Cerebral infarction, unspecified: Secondary | ICD-10-CM | POA: Diagnosis not present

## 2016-06-23 DIAGNOSIS — R262 Difficulty in walking, not elsewhere classified: Secondary | ICD-10-CM | POA: Diagnosis not present

## 2016-06-23 DIAGNOSIS — M6281 Muscle weakness (generalized): Secondary | ICD-10-CM | POA: Diagnosis not present

## 2016-06-23 DIAGNOSIS — I517 Cardiomegaly: Secondary | ICD-10-CM | POA: Diagnosis not present

## 2016-06-23 DIAGNOSIS — J209 Acute bronchitis, unspecified: Secondary | ICD-10-CM | POA: Diagnosis not present

## 2016-06-23 DIAGNOSIS — G8911 Acute pain due to trauma: Secondary | ICD-10-CM | POA: Diagnosis not present

## 2016-06-23 DIAGNOSIS — I214 Non-ST elevation (NSTEMI) myocardial infarction: Secondary | ICD-10-CM | POA: Diagnosis not present

## 2016-06-24 DIAGNOSIS — I214 Non-ST elevation (NSTEMI) myocardial infarction: Secondary | ICD-10-CM | POA: Diagnosis not present

## 2016-06-24 DIAGNOSIS — M6281 Muscle weakness (generalized): Secondary | ICD-10-CM | POA: Diagnosis not present

## 2016-06-24 DIAGNOSIS — G8911 Acute pain due to trauma: Secondary | ICD-10-CM | POA: Diagnosis not present

## 2016-06-24 DIAGNOSIS — I517 Cardiomegaly: Secondary | ICD-10-CM | POA: Diagnosis not present

## 2016-06-24 DIAGNOSIS — I639 Cerebral infarction, unspecified: Secondary | ICD-10-CM | POA: Diagnosis not present

## 2016-06-24 DIAGNOSIS — R262 Difficulty in walking, not elsewhere classified: Secondary | ICD-10-CM | POA: Diagnosis not present

## 2016-06-25 DIAGNOSIS — R262 Difficulty in walking, not elsewhere classified: Secondary | ICD-10-CM | POA: Diagnosis not present

## 2016-06-25 DIAGNOSIS — I639 Cerebral infarction, unspecified: Secondary | ICD-10-CM | POA: Diagnosis not present

## 2016-06-25 DIAGNOSIS — I517 Cardiomegaly: Secondary | ICD-10-CM | POA: Diagnosis not present

## 2016-06-25 DIAGNOSIS — G8911 Acute pain due to trauma: Secondary | ICD-10-CM | POA: Diagnosis not present

## 2016-06-25 DIAGNOSIS — I214 Non-ST elevation (NSTEMI) myocardial infarction: Secondary | ICD-10-CM | POA: Diagnosis not present

## 2016-06-25 DIAGNOSIS — M6281 Muscle weakness (generalized): Secondary | ICD-10-CM | POA: Diagnosis not present

## 2016-06-26 DIAGNOSIS — M6281 Muscle weakness (generalized): Secondary | ICD-10-CM | POA: Diagnosis not present

## 2016-06-26 DIAGNOSIS — R262 Difficulty in walking, not elsewhere classified: Secondary | ICD-10-CM | POA: Diagnosis not present

## 2016-06-26 DIAGNOSIS — I214 Non-ST elevation (NSTEMI) myocardial infarction: Secondary | ICD-10-CM | POA: Diagnosis not present

## 2016-06-26 DIAGNOSIS — I639 Cerebral infarction, unspecified: Secondary | ICD-10-CM | POA: Diagnosis not present

## 2016-06-26 DIAGNOSIS — I517 Cardiomegaly: Secondary | ICD-10-CM | POA: Diagnosis not present

## 2016-06-26 DIAGNOSIS — R05 Cough: Secondary | ICD-10-CM | POA: Diagnosis not present

## 2016-06-26 DIAGNOSIS — I4891 Unspecified atrial fibrillation: Secondary | ICD-10-CM | POA: Diagnosis not present

## 2016-06-26 DIAGNOSIS — I350 Nonrheumatic aortic (valve) stenosis: Secondary | ICD-10-CM | POA: Diagnosis not present

## 2016-06-29 DIAGNOSIS — I214 Non-ST elevation (NSTEMI) myocardial infarction: Secondary | ICD-10-CM | POA: Diagnosis not present

## 2016-06-29 DIAGNOSIS — R05 Cough: Secondary | ICD-10-CM | POA: Diagnosis not present

## 2016-06-29 DIAGNOSIS — M6281 Muscle weakness (generalized): Secondary | ICD-10-CM | POA: Diagnosis not present

## 2016-06-29 DIAGNOSIS — T814XXD Infection following a procedure, subsequent encounter: Secondary | ICD-10-CM | POA: Diagnosis not present

## 2016-06-29 DIAGNOSIS — R262 Difficulty in walking, not elsewhere classified: Secondary | ICD-10-CM | POA: Diagnosis not present

## 2016-06-29 DIAGNOSIS — L02213 Cutaneous abscess of chest wall: Secondary | ICD-10-CM | POA: Diagnosis not present

## 2016-06-29 DIAGNOSIS — I517 Cardiomegaly: Secondary | ICD-10-CM | POA: Diagnosis not present

## 2016-06-29 DIAGNOSIS — I350 Nonrheumatic aortic (valve) stenosis: Secondary | ICD-10-CM | POA: Diagnosis not present

## 2016-06-29 DIAGNOSIS — M069 Rheumatoid arthritis, unspecified: Secondary | ICD-10-CM | POA: Diagnosis not present

## 2016-06-29 DIAGNOSIS — M869 Osteomyelitis, unspecified: Secondary | ICD-10-CM | POA: Diagnosis not present

## 2016-06-29 DIAGNOSIS — I639 Cerebral infarction, unspecified: Secondary | ICD-10-CM | POA: Diagnosis not present

## 2016-06-30 DIAGNOSIS — I350 Nonrheumatic aortic (valve) stenosis: Secondary | ICD-10-CM | POA: Diagnosis not present

## 2016-06-30 DIAGNOSIS — M6281 Muscle weakness (generalized): Secondary | ICD-10-CM | POA: Diagnosis not present

## 2016-06-30 DIAGNOSIS — R262 Difficulty in walking, not elsewhere classified: Secondary | ICD-10-CM | POA: Diagnosis not present

## 2016-06-30 DIAGNOSIS — I517 Cardiomegaly: Secondary | ICD-10-CM | POA: Diagnosis not present

## 2016-06-30 DIAGNOSIS — I639 Cerebral infarction, unspecified: Secondary | ICD-10-CM | POA: Diagnosis not present

## 2016-06-30 DIAGNOSIS — I214 Non-ST elevation (NSTEMI) myocardial infarction: Secondary | ICD-10-CM | POA: Diagnosis not present

## 2016-07-01 DIAGNOSIS — I639 Cerebral infarction, unspecified: Secondary | ICD-10-CM | POA: Diagnosis not present

## 2016-07-01 DIAGNOSIS — I214 Non-ST elevation (NSTEMI) myocardial infarction: Secondary | ICD-10-CM | POA: Diagnosis not present

## 2016-07-01 DIAGNOSIS — R262 Difficulty in walking, not elsewhere classified: Secondary | ICD-10-CM | POA: Diagnosis not present

## 2016-07-01 DIAGNOSIS — M6281 Muscle weakness (generalized): Secondary | ICD-10-CM | POA: Diagnosis not present

## 2016-07-01 DIAGNOSIS — I350 Nonrheumatic aortic (valve) stenosis: Secondary | ICD-10-CM | POA: Diagnosis not present

## 2016-07-01 DIAGNOSIS — I517 Cardiomegaly: Secondary | ICD-10-CM | POA: Diagnosis not present

## 2016-07-02 DIAGNOSIS — R262 Difficulty in walking, not elsewhere classified: Secondary | ICD-10-CM | POA: Diagnosis not present

## 2016-07-02 DIAGNOSIS — I639 Cerebral infarction, unspecified: Secondary | ICD-10-CM | POA: Diagnosis not present

## 2016-07-02 DIAGNOSIS — I214 Non-ST elevation (NSTEMI) myocardial infarction: Secondary | ICD-10-CM | POA: Diagnosis not present

## 2016-07-02 DIAGNOSIS — I350 Nonrheumatic aortic (valve) stenosis: Secondary | ICD-10-CM | POA: Diagnosis not present

## 2016-07-02 DIAGNOSIS — M6281 Muscle weakness (generalized): Secondary | ICD-10-CM | POA: Diagnosis not present

## 2016-07-02 DIAGNOSIS — I517 Cardiomegaly: Secondary | ICD-10-CM | POA: Diagnosis not present

## 2016-07-03 DIAGNOSIS — R262 Difficulty in walking, not elsewhere classified: Secondary | ICD-10-CM | POA: Diagnosis not present

## 2016-07-03 DIAGNOSIS — M6281 Muscle weakness (generalized): Secondary | ICD-10-CM | POA: Diagnosis not present

## 2016-07-03 DIAGNOSIS — I214 Non-ST elevation (NSTEMI) myocardial infarction: Secondary | ICD-10-CM | POA: Diagnosis not present

## 2016-07-03 DIAGNOSIS — I517 Cardiomegaly: Secondary | ICD-10-CM | POA: Diagnosis not present

## 2016-07-03 DIAGNOSIS — I639 Cerebral infarction, unspecified: Secondary | ICD-10-CM | POA: Diagnosis not present

## 2016-07-03 DIAGNOSIS — I350 Nonrheumatic aortic (valve) stenosis: Secondary | ICD-10-CM | POA: Diagnosis not present

## 2016-07-06 DIAGNOSIS — I639 Cerebral infarction, unspecified: Secondary | ICD-10-CM | POA: Diagnosis not present

## 2016-07-06 DIAGNOSIS — M6281 Muscle weakness (generalized): Secondary | ICD-10-CM | POA: Diagnosis not present

## 2016-07-06 DIAGNOSIS — R262 Difficulty in walking, not elsewhere classified: Secondary | ICD-10-CM | POA: Diagnosis not present

## 2016-07-06 DIAGNOSIS — I214 Non-ST elevation (NSTEMI) myocardial infarction: Secondary | ICD-10-CM | POA: Diagnosis not present

## 2016-07-06 DIAGNOSIS — I350 Nonrheumatic aortic (valve) stenosis: Secondary | ICD-10-CM | POA: Diagnosis not present

## 2016-07-06 DIAGNOSIS — I517 Cardiomegaly: Secondary | ICD-10-CM | POA: Diagnosis not present

## 2016-07-07 DIAGNOSIS — M6281 Muscle weakness (generalized): Secondary | ICD-10-CM | POA: Diagnosis not present

## 2016-07-07 DIAGNOSIS — I639 Cerebral infarction, unspecified: Secondary | ICD-10-CM | POA: Diagnosis not present

## 2016-07-07 DIAGNOSIS — I350 Nonrheumatic aortic (valve) stenosis: Secondary | ICD-10-CM | POA: Diagnosis not present

## 2016-07-07 DIAGNOSIS — R262 Difficulty in walking, not elsewhere classified: Secondary | ICD-10-CM | POA: Diagnosis not present

## 2016-07-07 DIAGNOSIS — I214 Non-ST elevation (NSTEMI) myocardial infarction: Secondary | ICD-10-CM | POA: Diagnosis not present

## 2016-07-07 DIAGNOSIS — I517 Cardiomegaly: Secondary | ICD-10-CM | POA: Diagnosis not present

## 2016-07-08 ENCOUNTER — Other Ambulatory Visit
Admission: RE | Admit: 2016-07-08 | Discharge: 2016-07-08 | Disposition: A | Discharge status: Home / Self Care | Payer: Medicare Other | Source: Ambulatory Visit | Attending: Family Medicine | Admitting: Family Medicine

## 2016-07-08 DIAGNOSIS — R262 Difficulty in walking, not elsewhere classified: Secondary | ICD-10-CM | POA: Diagnosis not present

## 2016-07-08 DIAGNOSIS — M6281 Muscle weakness (generalized): Secondary | ICD-10-CM | POA: Diagnosis not present

## 2016-07-08 DIAGNOSIS — I6523 Occlusion and stenosis of bilateral carotid arteries: Secondary | ICD-10-CM | POA: Diagnosis not present

## 2016-07-08 DIAGNOSIS — I340 Nonrheumatic mitral (valve) insufficiency: Secondary | ICD-10-CM | POA: Diagnosis not present

## 2016-07-08 DIAGNOSIS — I952 Hypotension due to drugs: Secondary | ICD-10-CM | POA: Diagnosis not present

## 2016-07-08 DIAGNOSIS — I509 Heart failure, unspecified: Secondary | ICD-10-CM | POA: Insufficient documentation

## 2016-07-08 DIAGNOSIS — I10 Essential (primary) hypertension: Secondary | ICD-10-CM | POA: Diagnosis not present

## 2016-07-08 DIAGNOSIS — I119 Hypertensive heart disease without heart failure: Secondary | ICD-10-CM | POA: Diagnosis not present

## 2016-07-08 DIAGNOSIS — I6300 Cerebral infarction due to thrombosis of unspecified precerebral artery: Secondary | ICD-10-CM | POA: Diagnosis not present

## 2016-07-08 DIAGNOSIS — N390 Urinary tract infection, site not specified: Secondary | ICD-10-CM | POA: Diagnosis not present

## 2016-07-08 DIAGNOSIS — M2550 Pain in unspecified joint: Secondary | ICD-10-CM | POA: Diagnosis not present

## 2016-07-08 DIAGNOSIS — E782 Mixed hyperlipidemia: Secondary | ICD-10-CM | POA: Diagnosis not present

## 2016-07-08 DIAGNOSIS — I214 Non-ST elevation (NSTEMI) myocardial infarction: Secondary | ICD-10-CM | POA: Diagnosis not present

## 2016-07-08 DIAGNOSIS — R05 Cough: Secondary | ICD-10-CM | POA: Diagnosis not present

## 2016-07-08 DIAGNOSIS — I25810 Atherosclerosis of coronary artery bypass graft(s) without angina pectoris: Secondary | ICD-10-CM | POA: Diagnosis not present

## 2016-07-08 DIAGNOSIS — I639 Cerebral infarction, unspecified: Secondary | ICD-10-CM | POA: Diagnosis not present

## 2016-07-08 DIAGNOSIS — R0602 Shortness of breath: Secondary | ICD-10-CM | POA: Diagnosis not present

## 2016-07-08 DIAGNOSIS — I480 Paroxysmal atrial fibrillation: Secondary | ICD-10-CM | POA: Diagnosis not present

## 2016-07-08 DIAGNOSIS — I350 Nonrheumatic aortic (valve) stenosis: Secondary | ICD-10-CM | POA: Diagnosis not present

## 2016-07-08 DIAGNOSIS — I517 Cardiomegaly: Secondary | ICD-10-CM | POA: Diagnosis not present

## 2016-07-08 LAB — BRAIN NATRIURETIC PEPTIDE: B NATRIURETIC PEPTIDE 5: 115 pg/mL — AB (ref 0.0–100.0)

## 2016-07-09 DIAGNOSIS — I350 Nonrheumatic aortic (valve) stenosis: Secondary | ICD-10-CM | POA: Diagnosis not present

## 2016-07-09 DIAGNOSIS — M6281 Muscle weakness (generalized): Secondary | ICD-10-CM | POA: Diagnosis not present

## 2016-07-09 DIAGNOSIS — I517 Cardiomegaly: Secondary | ICD-10-CM | POA: Diagnosis not present

## 2016-07-09 DIAGNOSIS — I639 Cerebral infarction, unspecified: Secondary | ICD-10-CM | POA: Diagnosis not present

## 2016-07-09 DIAGNOSIS — I214 Non-ST elevation (NSTEMI) myocardial infarction: Secondary | ICD-10-CM | POA: Diagnosis not present

## 2016-07-09 DIAGNOSIS — R262 Difficulty in walking, not elsewhere classified: Secondary | ICD-10-CM | POA: Diagnosis not present

## 2016-07-10 DIAGNOSIS — R262 Difficulty in walking, not elsewhere classified: Secondary | ICD-10-CM | POA: Diagnosis not present

## 2016-07-10 DIAGNOSIS — M6281 Muscle weakness (generalized): Secondary | ICD-10-CM | POA: Diagnosis not present

## 2016-07-10 DIAGNOSIS — I350 Nonrheumatic aortic (valve) stenosis: Secondary | ICD-10-CM | POA: Diagnosis not present

## 2016-07-10 DIAGNOSIS — I214 Non-ST elevation (NSTEMI) myocardial infarction: Secondary | ICD-10-CM | POA: Diagnosis not present

## 2016-07-10 DIAGNOSIS — I639 Cerebral infarction, unspecified: Secondary | ICD-10-CM | POA: Diagnosis not present

## 2016-07-10 DIAGNOSIS — I517 Cardiomegaly: Secondary | ICD-10-CM | POA: Diagnosis not present

## 2016-07-13 DIAGNOSIS — R262 Difficulty in walking, not elsewhere classified: Secondary | ICD-10-CM | POA: Diagnosis not present

## 2016-07-13 DIAGNOSIS — I350 Nonrheumatic aortic (valve) stenosis: Secondary | ICD-10-CM | POA: Diagnosis not present

## 2016-07-13 DIAGNOSIS — I214 Non-ST elevation (NSTEMI) myocardial infarction: Secondary | ICD-10-CM | POA: Diagnosis not present

## 2016-07-13 DIAGNOSIS — I639 Cerebral infarction, unspecified: Secondary | ICD-10-CM | POA: Diagnosis not present

## 2016-07-13 DIAGNOSIS — I517 Cardiomegaly: Secondary | ICD-10-CM | POA: Diagnosis not present

## 2016-07-13 DIAGNOSIS — M6281 Muscle weakness (generalized): Secondary | ICD-10-CM | POA: Diagnosis not present

## 2016-07-14 DIAGNOSIS — I517 Cardiomegaly: Secondary | ICD-10-CM | POA: Diagnosis not present

## 2016-07-14 DIAGNOSIS — I639 Cerebral infarction, unspecified: Secondary | ICD-10-CM | POA: Diagnosis not present

## 2016-07-14 DIAGNOSIS — I214 Non-ST elevation (NSTEMI) myocardial infarction: Secondary | ICD-10-CM | POA: Diagnosis not present

## 2016-07-14 DIAGNOSIS — R262 Difficulty in walking, not elsewhere classified: Secondary | ICD-10-CM | POA: Diagnosis not present

## 2016-07-14 DIAGNOSIS — M6281 Muscle weakness (generalized): Secondary | ICD-10-CM | POA: Diagnosis not present

## 2016-07-14 DIAGNOSIS — I350 Nonrheumatic aortic (valve) stenosis: Secondary | ICD-10-CM | POA: Diagnosis not present

## 2016-07-15 DIAGNOSIS — I214 Non-ST elevation (NSTEMI) myocardial infarction: Secondary | ICD-10-CM | POA: Diagnosis not present

## 2016-07-15 DIAGNOSIS — R262 Difficulty in walking, not elsewhere classified: Secondary | ICD-10-CM | POA: Diagnosis not present

## 2016-07-15 DIAGNOSIS — I517 Cardiomegaly: Secondary | ICD-10-CM | POA: Diagnosis not present

## 2016-07-15 DIAGNOSIS — M6281 Muscle weakness (generalized): Secondary | ICD-10-CM | POA: Diagnosis not present

## 2016-07-15 DIAGNOSIS — I350 Nonrheumatic aortic (valve) stenosis: Secondary | ICD-10-CM | POA: Diagnosis not present

## 2016-07-15 DIAGNOSIS — I639 Cerebral infarction, unspecified: Secondary | ICD-10-CM | POA: Diagnosis not present

## 2016-07-22 DIAGNOSIS — I6930 Unspecified sequelae of cerebral infarction: Secondary | ICD-10-CM | POA: Diagnosis not present

## 2016-07-22 DIAGNOSIS — F419 Anxiety disorder, unspecified: Secondary | ICD-10-CM | POA: Diagnosis not present

## 2016-07-22 DIAGNOSIS — M069 Rheumatoid arthritis, unspecified: Secondary | ICD-10-CM | POA: Diagnosis not present

## 2016-07-22 DIAGNOSIS — G4700 Insomnia, unspecified: Secondary | ICD-10-CM | POA: Diagnosis not present

## 2016-08-02 DIAGNOSIS — M0579 Rheumatoid arthritis with rheumatoid factor of multiple sites without organ or systems involvement: Secondary | ICD-10-CM | POA: Diagnosis not present

## 2016-08-02 DIAGNOSIS — E785 Hyperlipidemia, unspecified: Secondary | ICD-10-CM | POA: Diagnosis not present

## 2016-08-02 DIAGNOSIS — I4891 Unspecified atrial fibrillation: Secondary | ICD-10-CM | POA: Diagnosis not present

## 2016-08-02 DIAGNOSIS — Z8673 Personal history of transient ischemic attack (TIA), and cerebral infarction without residual deficits: Secondary | ICD-10-CM | POA: Diagnosis not present

## 2016-08-02 DIAGNOSIS — Z7901 Long term (current) use of anticoagulants: Secondary | ICD-10-CM | POA: Diagnosis not present

## 2016-08-02 DIAGNOSIS — K5900 Constipation, unspecified: Secondary | ICD-10-CM | POA: Diagnosis not present

## 2016-08-02 DIAGNOSIS — K219 Gastro-esophageal reflux disease without esophagitis: Secondary | ICD-10-CM | POA: Diagnosis not present

## 2016-08-02 DIAGNOSIS — F329 Major depressive disorder, single episode, unspecified: Secondary | ICD-10-CM | POA: Diagnosis not present

## 2016-08-02 DIAGNOSIS — I2510 Atherosclerotic heart disease of native coronary artery without angina pectoris: Secondary | ICD-10-CM | POA: Diagnosis not present

## 2016-08-02 DIAGNOSIS — F419 Anxiety disorder, unspecified: Secondary | ICD-10-CM | POA: Diagnosis not present

## 2016-08-02 DIAGNOSIS — D649 Anemia, unspecified: Secondary | ICD-10-CM | POA: Diagnosis not present

## 2016-08-02 DIAGNOSIS — M810 Age-related osteoporosis without current pathological fracture: Secondary | ICD-10-CM | POA: Diagnosis not present

## 2016-08-03 DIAGNOSIS — M810 Age-related osteoporosis without current pathological fracture: Secondary | ICD-10-CM | POA: Diagnosis not present

## 2016-08-03 DIAGNOSIS — M899 Disorder of bone, unspecified: Secondary | ICD-10-CM | POA: Diagnosis not present

## 2016-08-03 DIAGNOSIS — M79651 Pain in right thigh: Secondary | ICD-10-CM | POA: Diagnosis not present

## 2016-08-03 DIAGNOSIS — M0579 Rheumatoid arthritis with rheumatoid factor of multiple sites without organ or systems involvement: Secondary | ICD-10-CM | POA: Diagnosis not present

## 2016-08-25 DIAGNOSIS — I739 Peripheral vascular disease, unspecified: Secondary | ICD-10-CM | POA: Diagnosis not present

## 2016-08-25 DIAGNOSIS — B351 Tinea unguium: Secondary | ICD-10-CM | POA: Diagnosis not present

## 2016-08-25 DIAGNOSIS — L603 Nail dystrophy: Secondary | ICD-10-CM | POA: Diagnosis not present

## 2016-08-26 DIAGNOSIS — D519 Vitamin B12 deficiency anemia, unspecified: Secondary | ICD-10-CM | POA: Diagnosis not present

## 2016-08-26 DIAGNOSIS — E559 Vitamin D deficiency, unspecified: Secondary | ICD-10-CM | POA: Diagnosis not present

## 2016-08-26 DIAGNOSIS — D649 Anemia, unspecified: Secondary | ICD-10-CM | POA: Diagnosis not present

## 2016-08-27 DIAGNOSIS — I4891 Unspecified atrial fibrillation: Secondary | ICD-10-CM | POA: Diagnosis not present

## 2016-08-27 DIAGNOSIS — R262 Difficulty in walking, not elsewhere classified: Secondary | ICD-10-CM | POA: Diagnosis not present

## 2016-08-27 DIAGNOSIS — M6281 Muscle weakness (generalized): Secondary | ICD-10-CM | POA: Diagnosis not present

## 2016-08-27 DIAGNOSIS — I2510 Atherosclerotic heart disease of native coronary artery without angina pectoris: Secondary | ICD-10-CM | POA: Diagnosis not present

## 2016-08-27 DIAGNOSIS — I350 Nonrheumatic aortic (valve) stenosis: Secondary | ICD-10-CM | POA: Diagnosis not present

## 2016-08-27 DIAGNOSIS — I517 Cardiomegaly: Secondary | ICD-10-CM | POA: Diagnosis not present

## 2016-08-27 DIAGNOSIS — I214 Non-ST elevation (NSTEMI) myocardial infarction: Secondary | ICD-10-CM | POA: Diagnosis not present

## 2016-08-28 DIAGNOSIS — I2510 Atherosclerotic heart disease of native coronary artery without angina pectoris: Secondary | ICD-10-CM | POA: Diagnosis not present

## 2016-08-28 DIAGNOSIS — I214 Non-ST elevation (NSTEMI) myocardial infarction: Secondary | ICD-10-CM | POA: Diagnosis not present

## 2016-08-28 DIAGNOSIS — M6281 Muscle weakness (generalized): Secondary | ICD-10-CM | POA: Diagnosis not present

## 2016-08-28 DIAGNOSIS — I350 Nonrheumatic aortic (valve) stenosis: Secondary | ICD-10-CM | POA: Diagnosis not present

## 2016-08-28 DIAGNOSIS — I517 Cardiomegaly: Secondary | ICD-10-CM | POA: Diagnosis not present

## 2016-08-28 DIAGNOSIS — R262 Difficulty in walking, not elsewhere classified: Secondary | ICD-10-CM | POA: Diagnosis not present

## 2016-08-31 DIAGNOSIS — I350 Nonrheumatic aortic (valve) stenosis: Secondary | ICD-10-CM | POA: Diagnosis not present

## 2016-08-31 DIAGNOSIS — I214 Non-ST elevation (NSTEMI) myocardial infarction: Secondary | ICD-10-CM | POA: Diagnosis not present

## 2016-08-31 DIAGNOSIS — M6281 Muscle weakness (generalized): Secondary | ICD-10-CM | POA: Diagnosis not present

## 2016-08-31 DIAGNOSIS — I2510 Atherosclerotic heart disease of native coronary artery without angina pectoris: Secondary | ICD-10-CM | POA: Diagnosis not present

## 2016-08-31 DIAGNOSIS — Z792 Long term (current) use of antibiotics: Secondary | ICD-10-CM | POA: Diagnosis not present

## 2016-08-31 DIAGNOSIS — T814XXD Infection following a procedure, subsequent encounter: Secondary | ICD-10-CM | POA: Diagnosis not present

## 2016-08-31 DIAGNOSIS — R262 Difficulty in walking, not elsewhere classified: Secondary | ICD-10-CM | POA: Diagnosis not present

## 2016-08-31 DIAGNOSIS — L02213 Cutaneous abscess of chest wall: Secondary | ICD-10-CM | POA: Diagnosis not present

## 2016-08-31 DIAGNOSIS — I517 Cardiomegaly: Secondary | ICD-10-CM | POA: Diagnosis not present

## 2016-08-31 DIAGNOSIS — M869 Osteomyelitis, unspecified: Secondary | ICD-10-CM | POA: Diagnosis not present

## 2016-09-01 DIAGNOSIS — I2510 Atherosclerotic heart disease of native coronary artery without angina pectoris: Secondary | ICD-10-CM | POA: Diagnosis not present

## 2016-09-01 DIAGNOSIS — M6281 Muscle weakness (generalized): Secondary | ICD-10-CM | POA: Diagnosis not present

## 2016-09-01 DIAGNOSIS — I517 Cardiomegaly: Secondary | ICD-10-CM | POA: Diagnosis not present

## 2016-09-01 DIAGNOSIS — I350 Nonrheumatic aortic (valve) stenosis: Secondary | ICD-10-CM | POA: Diagnosis not present

## 2016-09-01 DIAGNOSIS — I214 Non-ST elevation (NSTEMI) myocardial infarction: Secondary | ICD-10-CM | POA: Diagnosis not present

## 2016-09-01 DIAGNOSIS — R262 Difficulty in walking, not elsewhere classified: Secondary | ICD-10-CM | POA: Diagnosis not present

## 2016-09-02 DIAGNOSIS — I214 Non-ST elevation (NSTEMI) myocardial infarction: Secondary | ICD-10-CM | POA: Diagnosis not present

## 2016-09-02 DIAGNOSIS — I350 Nonrheumatic aortic (valve) stenosis: Secondary | ICD-10-CM | POA: Diagnosis not present

## 2016-09-02 DIAGNOSIS — I517 Cardiomegaly: Secondary | ICD-10-CM | POA: Diagnosis not present

## 2016-09-02 DIAGNOSIS — I2510 Atherosclerotic heart disease of native coronary artery without angina pectoris: Secondary | ICD-10-CM | POA: Diagnosis not present

## 2016-09-02 DIAGNOSIS — R262 Difficulty in walking, not elsewhere classified: Secondary | ICD-10-CM | POA: Diagnosis not present

## 2016-09-02 DIAGNOSIS — M6281 Muscle weakness (generalized): Secondary | ICD-10-CM | POA: Diagnosis not present

## 2016-09-03 DIAGNOSIS — I517 Cardiomegaly: Secondary | ICD-10-CM | POA: Diagnosis not present

## 2016-09-03 DIAGNOSIS — M6281 Muscle weakness (generalized): Secondary | ICD-10-CM | POA: Diagnosis not present

## 2016-09-03 DIAGNOSIS — I2510 Atherosclerotic heart disease of native coronary artery without angina pectoris: Secondary | ICD-10-CM | POA: Diagnosis not present

## 2016-09-03 DIAGNOSIS — I214 Non-ST elevation (NSTEMI) myocardial infarction: Secondary | ICD-10-CM | POA: Diagnosis not present

## 2016-09-03 DIAGNOSIS — R262 Difficulty in walking, not elsewhere classified: Secondary | ICD-10-CM | POA: Diagnosis not present

## 2016-09-03 DIAGNOSIS — I350 Nonrheumatic aortic (valve) stenosis: Secondary | ICD-10-CM | POA: Diagnosis not present

## 2016-09-04 DIAGNOSIS — I350 Nonrheumatic aortic (valve) stenosis: Secondary | ICD-10-CM | POA: Diagnosis not present

## 2016-09-04 DIAGNOSIS — I2510 Atherosclerotic heart disease of native coronary artery without angina pectoris: Secondary | ICD-10-CM | POA: Diagnosis not present

## 2016-09-04 DIAGNOSIS — I517 Cardiomegaly: Secondary | ICD-10-CM | POA: Diagnosis not present

## 2016-09-04 DIAGNOSIS — M6281 Muscle weakness (generalized): Secondary | ICD-10-CM | POA: Diagnosis not present

## 2016-09-04 DIAGNOSIS — R262 Difficulty in walking, not elsewhere classified: Secondary | ICD-10-CM | POA: Diagnosis not present

## 2016-09-04 DIAGNOSIS — I214 Non-ST elevation (NSTEMI) myocardial infarction: Secondary | ICD-10-CM | POA: Diagnosis not present

## 2016-09-07 DIAGNOSIS — I517 Cardiomegaly: Secondary | ICD-10-CM | POA: Diagnosis not present

## 2016-09-07 DIAGNOSIS — I350 Nonrheumatic aortic (valve) stenosis: Secondary | ICD-10-CM | POA: Diagnosis not present

## 2016-09-07 DIAGNOSIS — M6281 Muscle weakness (generalized): Secondary | ICD-10-CM | POA: Diagnosis not present

## 2016-09-07 DIAGNOSIS — I214 Non-ST elevation (NSTEMI) myocardial infarction: Secondary | ICD-10-CM | POA: Diagnosis not present

## 2016-09-07 DIAGNOSIS — I2510 Atherosclerotic heart disease of native coronary artery without angina pectoris: Secondary | ICD-10-CM | POA: Diagnosis not present

## 2016-09-07 DIAGNOSIS — R262 Difficulty in walking, not elsewhere classified: Secondary | ICD-10-CM | POA: Diagnosis not present

## 2016-09-08 DIAGNOSIS — I350 Nonrheumatic aortic (valve) stenosis: Secondary | ICD-10-CM | POA: Diagnosis not present

## 2016-09-08 DIAGNOSIS — I517 Cardiomegaly: Secondary | ICD-10-CM | POA: Diagnosis not present

## 2016-09-08 DIAGNOSIS — I2510 Atherosclerotic heart disease of native coronary artery without angina pectoris: Secondary | ICD-10-CM | POA: Diagnosis not present

## 2016-09-08 DIAGNOSIS — I214 Non-ST elevation (NSTEMI) myocardial infarction: Secondary | ICD-10-CM | POA: Diagnosis not present

## 2016-09-08 DIAGNOSIS — M6281 Muscle weakness (generalized): Secondary | ICD-10-CM | POA: Diagnosis not present

## 2016-09-08 DIAGNOSIS — R262 Difficulty in walking, not elsewhere classified: Secondary | ICD-10-CM | POA: Diagnosis not present

## 2016-09-09 DIAGNOSIS — I2510 Atherosclerotic heart disease of native coronary artery without angina pectoris: Secondary | ICD-10-CM | POA: Diagnosis not present

## 2016-09-09 DIAGNOSIS — M6281 Muscle weakness (generalized): Secondary | ICD-10-CM | POA: Diagnosis not present

## 2016-09-09 DIAGNOSIS — I214 Non-ST elevation (NSTEMI) myocardial infarction: Secondary | ICD-10-CM | POA: Diagnosis not present

## 2016-09-09 DIAGNOSIS — I350 Nonrheumatic aortic (valve) stenosis: Secondary | ICD-10-CM | POA: Diagnosis not present

## 2016-09-09 DIAGNOSIS — I517 Cardiomegaly: Secondary | ICD-10-CM | POA: Diagnosis not present

## 2016-09-09 DIAGNOSIS — R262 Difficulty in walking, not elsewhere classified: Secondary | ICD-10-CM | POA: Diagnosis not present

## 2016-09-10 DIAGNOSIS — I2510 Atherosclerotic heart disease of native coronary artery without angina pectoris: Secondary | ICD-10-CM | POA: Diagnosis not present

## 2016-09-10 DIAGNOSIS — R262 Difficulty in walking, not elsewhere classified: Secondary | ICD-10-CM | POA: Diagnosis not present

## 2016-09-10 DIAGNOSIS — I214 Non-ST elevation (NSTEMI) myocardial infarction: Secondary | ICD-10-CM | POA: Diagnosis not present

## 2016-09-10 DIAGNOSIS — M6281 Muscle weakness (generalized): Secondary | ICD-10-CM | POA: Diagnosis not present

## 2016-09-10 DIAGNOSIS — I517 Cardiomegaly: Secondary | ICD-10-CM | POA: Diagnosis not present

## 2016-09-10 DIAGNOSIS — I350 Nonrheumatic aortic (valve) stenosis: Secondary | ICD-10-CM | POA: Diagnosis not present

## 2016-09-11 DIAGNOSIS — R262 Difficulty in walking, not elsewhere classified: Secondary | ICD-10-CM | POA: Diagnosis not present

## 2016-09-11 DIAGNOSIS — M6281 Muscle weakness (generalized): Secondary | ICD-10-CM | POA: Diagnosis not present

## 2016-09-11 DIAGNOSIS — I350 Nonrheumatic aortic (valve) stenosis: Secondary | ICD-10-CM | POA: Diagnosis not present

## 2016-09-11 DIAGNOSIS — I2510 Atherosclerotic heart disease of native coronary artery without angina pectoris: Secondary | ICD-10-CM | POA: Diagnosis not present

## 2016-09-11 DIAGNOSIS — I517 Cardiomegaly: Secondary | ICD-10-CM | POA: Diagnosis not present

## 2016-09-11 DIAGNOSIS — I214 Non-ST elevation (NSTEMI) myocardial infarction: Secondary | ICD-10-CM | POA: Diagnosis not present

## 2016-09-14 DIAGNOSIS — I214 Non-ST elevation (NSTEMI) myocardial infarction: Secondary | ICD-10-CM | POA: Diagnosis not present

## 2016-09-14 DIAGNOSIS — I350 Nonrheumatic aortic (valve) stenosis: Secondary | ICD-10-CM | POA: Diagnosis not present

## 2016-09-14 DIAGNOSIS — I2510 Atherosclerotic heart disease of native coronary artery without angina pectoris: Secondary | ICD-10-CM | POA: Diagnosis not present

## 2016-09-14 DIAGNOSIS — I517 Cardiomegaly: Secondary | ICD-10-CM | POA: Diagnosis not present

## 2016-09-14 DIAGNOSIS — M6281 Muscle weakness (generalized): Secondary | ICD-10-CM | POA: Diagnosis not present

## 2016-09-14 DIAGNOSIS — R262 Difficulty in walking, not elsewhere classified: Secondary | ICD-10-CM | POA: Diagnosis not present

## 2016-09-15 DIAGNOSIS — R262 Difficulty in walking, not elsewhere classified: Secondary | ICD-10-CM | POA: Diagnosis not present

## 2016-09-15 DIAGNOSIS — I517 Cardiomegaly: Secondary | ICD-10-CM | POA: Diagnosis not present

## 2016-09-15 DIAGNOSIS — I350 Nonrheumatic aortic (valve) stenosis: Secondary | ICD-10-CM | POA: Diagnosis not present

## 2016-09-15 DIAGNOSIS — I2510 Atherosclerotic heart disease of native coronary artery without angina pectoris: Secondary | ICD-10-CM | POA: Diagnosis not present

## 2016-09-15 DIAGNOSIS — M6281 Muscle weakness (generalized): Secondary | ICD-10-CM | POA: Diagnosis not present

## 2016-09-15 DIAGNOSIS — I214 Non-ST elevation (NSTEMI) myocardial infarction: Secondary | ICD-10-CM | POA: Diagnosis not present

## 2016-09-16 DIAGNOSIS — F419 Anxiety disorder, unspecified: Secondary | ICD-10-CM | POA: Diagnosis not present

## 2016-09-16 DIAGNOSIS — M6281 Muscle weakness (generalized): Secondary | ICD-10-CM | POA: Diagnosis not present

## 2016-09-16 DIAGNOSIS — G4700 Insomnia, unspecified: Secondary | ICD-10-CM | POA: Diagnosis not present

## 2016-09-16 DIAGNOSIS — I2510 Atherosclerotic heart disease of native coronary artery without angina pectoris: Secondary | ICD-10-CM | POA: Diagnosis not present

## 2016-09-16 DIAGNOSIS — I6930 Unspecified sequelae of cerebral infarction: Secondary | ICD-10-CM | POA: Diagnosis not present

## 2016-09-16 DIAGNOSIS — I517 Cardiomegaly: Secondary | ICD-10-CM | POA: Diagnosis not present

## 2016-09-16 DIAGNOSIS — I350 Nonrheumatic aortic (valve) stenosis: Secondary | ICD-10-CM | POA: Diagnosis not present

## 2016-09-16 DIAGNOSIS — I214 Non-ST elevation (NSTEMI) myocardial infarction: Secondary | ICD-10-CM | POA: Diagnosis not present

## 2016-09-16 DIAGNOSIS — R262 Difficulty in walking, not elsewhere classified: Secondary | ICD-10-CM | POA: Diagnosis not present

## 2016-09-16 DIAGNOSIS — M069 Rheumatoid arthritis, unspecified: Secondary | ICD-10-CM | POA: Diagnosis not present

## 2016-09-17 DIAGNOSIS — I517 Cardiomegaly: Secondary | ICD-10-CM | POA: Diagnosis not present

## 2016-09-17 DIAGNOSIS — R262 Difficulty in walking, not elsewhere classified: Secondary | ICD-10-CM | POA: Diagnosis not present

## 2016-09-17 DIAGNOSIS — I350 Nonrheumatic aortic (valve) stenosis: Secondary | ICD-10-CM | POA: Diagnosis not present

## 2016-09-17 DIAGNOSIS — I2510 Atherosclerotic heart disease of native coronary artery without angina pectoris: Secondary | ICD-10-CM | POA: Diagnosis not present

## 2016-09-17 DIAGNOSIS — M6281 Muscle weakness (generalized): Secondary | ICD-10-CM | POA: Diagnosis not present

## 2016-09-17 DIAGNOSIS — I214 Non-ST elevation (NSTEMI) myocardial infarction: Secondary | ICD-10-CM | POA: Diagnosis not present

## 2016-09-18 DIAGNOSIS — I2510 Atherosclerotic heart disease of native coronary artery without angina pectoris: Secondary | ICD-10-CM | POA: Diagnosis not present

## 2016-09-18 DIAGNOSIS — I350 Nonrheumatic aortic (valve) stenosis: Secondary | ICD-10-CM | POA: Diagnosis not present

## 2016-09-18 DIAGNOSIS — M6281 Muscle weakness (generalized): Secondary | ICD-10-CM | POA: Diagnosis not present

## 2016-09-18 DIAGNOSIS — R262 Difficulty in walking, not elsewhere classified: Secondary | ICD-10-CM | POA: Diagnosis not present

## 2016-09-18 DIAGNOSIS — I517 Cardiomegaly: Secondary | ICD-10-CM | POA: Diagnosis not present

## 2016-09-18 DIAGNOSIS — I214 Non-ST elevation (NSTEMI) myocardial infarction: Secondary | ICD-10-CM | POA: Diagnosis not present

## 2016-09-21 DIAGNOSIS — M6281 Muscle weakness (generalized): Secondary | ICD-10-CM | POA: Diagnosis not present

## 2016-09-21 DIAGNOSIS — R262 Difficulty in walking, not elsewhere classified: Secondary | ICD-10-CM | POA: Diagnosis not present

## 2016-09-21 DIAGNOSIS — I350 Nonrheumatic aortic (valve) stenosis: Secondary | ICD-10-CM | POA: Diagnosis not present

## 2016-09-21 DIAGNOSIS — I2510 Atherosclerotic heart disease of native coronary artery without angina pectoris: Secondary | ICD-10-CM | POA: Diagnosis not present

## 2016-09-21 DIAGNOSIS — I517 Cardiomegaly: Secondary | ICD-10-CM | POA: Diagnosis not present

## 2016-09-21 DIAGNOSIS — I214 Non-ST elevation (NSTEMI) myocardial infarction: Secondary | ICD-10-CM | POA: Diagnosis not present

## 2016-09-22 DIAGNOSIS — R262 Difficulty in walking, not elsewhere classified: Secondary | ICD-10-CM | POA: Diagnosis not present

## 2016-09-22 DIAGNOSIS — I2510 Atherosclerotic heart disease of native coronary artery without angina pectoris: Secondary | ICD-10-CM | POA: Diagnosis not present

## 2016-09-22 DIAGNOSIS — I517 Cardiomegaly: Secondary | ICD-10-CM | POA: Diagnosis not present

## 2016-09-22 DIAGNOSIS — I214 Non-ST elevation (NSTEMI) myocardial infarction: Secondary | ICD-10-CM | POA: Diagnosis not present

## 2016-09-22 DIAGNOSIS — I350 Nonrheumatic aortic (valve) stenosis: Secondary | ICD-10-CM | POA: Diagnosis not present

## 2016-09-22 DIAGNOSIS — M6281 Muscle weakness (generalized): Secondary | ICD-10-CM | POA: Diagnosis not present

## 2016-09-23 DIAGNOSIS — R262 Difficulty in walking, not elsewhere classified: Secondary | ICD-10-CM | POA: Diagnosis not present

## 2016-09-23 DIAGNOSIS — I2510 Atherosclerotic heart disease of native coronary artery without angina pectoris: Secondary | ICD-10-CM | POA: Diagnosis not present

## 2016-09-23 DIAGNOSIS — I517 Cardiomegaly: Secondary | ICD-10-CM | POA: Diagnosis not present

## 2016-09-23 DIAGNOSIS — I214 Non-ST elevation (NSTEMI) myocardial infarction: Secondary | ICD-10-CM | POA: Diagnosis not present

## 2016-09-23 DIAGNOSIS — M6281 Muscle weakness (generalized): Secondary | ICD-10-CM | POA: Diagnosis not present

## 2016-09-23 DIAGNOSIS — I350 Nonrheumatic aortic (valve) stenosis: Secondary | ICD-10-CM | POA: Diagnosis not present

## 2016-09-24 DIAGNOSIS — I517 Cardiomegaly: Secondary | ICD-10-CM | POA: Diagnosis not present

## 2016-09-24 DIAGNOSIS — I2510 Atherosclerotic heart disease of native coronary artery without angina pectoris: Secondary | ICD-10-CM | POA: Diagnosis not present

## 2016-09-24 DIAGNOSIS — R262 Difficulty in walking, not elsewhere classified: Secondary | ICD-10-CM | POA: Diagnosis not present

## 2016-09-24 DIAGNOSIS — I214 Non-ST elevation (NSTEMI) myocardial infarction: Secondary | ICD-10-CM | POA: Diagnosis not present

## 2016-09-24 DIAGNOSIS — I350 Nonrheumatic aortic (valve) stenosis: Secondary | ICD-10-CM | POA: Diagnosis not present

## 2016-09-24 DIAGNOSIS — M6281 Muscle weakness (generalized): Secondary | ICD-10-CM | POA: Diagnosis not present

## 2016-09-30 DIAGNOSIS — I4891 Unspecified atrial fibrillation: Secondary | ICD-10-CM | POA: Diagnosis not present

## 2016-09-30 DIAGNOSIS — I10 Essential (primary) hypertension: Secondary | ICD-10-CM | POA: Diagnosis not present

## 2016-09-30 DIAGNOSIS — F329 Major depressive disorder, single episode, unspecified: Secondary | ICD-10-CM | POA: Diagnosis not present

## 2016-09-30 DIAGNOSIS — I2510 Atherosclerotic heart disease of native coronary artery without angina pectoris: Secondary | ICD-10-CM | POA: Diagnosis not present

## 2016-09-30 DIAGNOSIS — Z792 Long term (current) use of antibiotics: Secondary | ICD-10-CM | POA: Diagnosis not present

## 2016-09-30 DIAGNOSIS — M810 Age-related osteoporosis without current pathological fracture: Secondary | ICD-10-CM | POA: Diagnosis not present

## 2016-09-30 DIAGNOSIS — Z7901 Long term (current) use of anticoagulants: Secondary | ICD-10-CM | POA: Diagnosis not present

## 2016-09-30 DIAGNOSIS — F419 Anxiety disorder, unspecified: Secondary | ICD-10-CM | POA: Diagnosis not present

## 2016-09-30 DIAGNOSIS — M0579 Rheumatoid arthritis with rheumatoid factor of multiple sites without organ or systems involvement: Secondary | ICD-10-CM | POA: Diagnosis not present

## 2016-09-30 DIAGNOSIS — D649 Anemia, unspecified: Secondary | ICD-10-CM | POA: Diagnosis not present

## 2016-09-30 DIAGNOSIS — Z8673 Personal history of transient ischemic attack (TIA), and cerebral infarction without residual deficits: Secondary | ICD-10-CM | POA: Diagnosis not present

## 2016-09-30 DIAGNOSIS — K219 Gastro-esophageal reflux disease without esophagitis: Secondary | ICD-10-CM | POA: Diagnosis not present

## 2016-10-07 DIAGNOSIS — I10 Essential (primary) hypertension: Secondary | ICD-10-CM | POA: Diagnosis not present

## 2016-10-07 DIAGNOSIS — I25810 Atherosclerosis of coronary artery bypass graft(s) without angina pectoris: Secondary | ICD-10-CM | POA: Diagnosis not present

## 2016-10-07 DIAGNOSIS — I350 Nonrheumatic aortic (valve) stenosis: Secondary | ICD-10-CM | POA: Diagnosis not present

## 2016-10-07 DIAGNOSIS — I480 Paroxysmal atrial fibrillation: Secondary | ICD-10-CM | POA: Diagnosis not present

## 2016-10-13 DIAGNOSIS — R296 Repeated falls: Secondary | ICD-10-CM | POA: Diagnosis not present

## 2016-10-26 DIAGNOSIS — M069 Rheumatoid arthritis, unspecified: Secondary | ICD-10-CM | POA: Diagnosis not present

## 2016-10-26 DIAGNOSIS — M869 Osteomyelitis, unspecified: Secondary | ICD-10-CM | POA: Diagnosis not present

## 2016-10-26 DIAGNOSIS — Z792 Long term (current) use of antibiotics: Secondary | ICD-10-CM | POA: Diagnosis not present

## 2016-10-26 DIAGNOSIS — G4700 Insomnia, unspecified: Secondary | ICD-10-CM | POA: Diagnosis not present

## 2016-10-26 DIAGNOSIS — I6930 Unspecified sequelae of cerebral infarction: Secondary | ICD-10-CM | POA: Diagnosis not present

## 2016-10-26 DIAGNOSIS — M0579 Rheumatoid arthritis with rheumatoid factor of multiple sites without organ or systems involvement: Secondary | ICD-10-CM | POA: Diagnosis not present

## 2016-10-26 DIAGNOSIS — F419 Anxiety disorder, unspecified: Secondary | ICD-10-CM | POA: Diagnosis not present

## 2016-10-30 DIAGNOSIS — I214 Non-ST elevation (NSTEMI) myocardial infarction: Secondary | ICD-10-CM | POA: Diagnosis not present

## 2016-10-30 DIAGNOSIS — I517 Cardiomegaly: Secondary | ICD-10-CM | POA: Diagnosis not present

## 2016-10-30 DIAGNOSIS — R296 Repeated falls: Secondary | ICD-10-CM | POA: Diagnosis not present

## 2016-10-30 DIAGNOSIS — R262 Difficulty in walking, not elsewhere classified: Secondary | ICD-10-CM | POA: Diagnosis not present

## 2016-10-30 DIAGNOSIS — M6281 Muscle weakness (generalized): Secondary | ICD-10-CM | POA: Diagnosis not present

## 2016-10-30 DIAGNOSIS — I350 Nonrheumatic aortic (valve) stenosis: Secondary | ICD-10-CM | POA: Diagnosis not present

## 2016-10-30 DIAGNOSIS — I4891 Unspecified atrial fibrillation: Secondary | ICD-10-CM | POA: Diagnosis not present

## 2016-10-30 DIAGNOSIS — I639 Cerebral infarction, unspecified: Secondary | ICD-10-CM | POA: Diagnosis not present

## 2016-11-02 DIAGNOSIS — M6281 Muscle weakness (generalized): Secondary | ICD-10-CM | POA: Diagnosis not present

## 2016-11-02 DIAGNOSIS — R296 Repeated falls: Secondary | ICD-10-CM | POA: Diagnosis not present

## 2016-11-02 DIAGNOSIS — R262 Difficulty in walking, not elsewhere classified: Secondary | ICD-10-CM | POA: Diagnosis not present

## 2016-11-02 DIAGNOSIS — I214 Non-ST elevation (NSTEMI) myocardial infarction: Secondary | ICD-10-CM | POA: Diagnosis not present

## 2016-11-02 DIAGNOSIS — I517 Cardiomegaly: Secondary | ICD-10-CM | POA: Diagnosis not present

## 2016-11-02 DIAGNOSIS — I639 Cerebral infarction, unspecified: Secondary | ICD-10-CM | POA: Diagnosis not present

## 2016-11-03 DIAGNOSIS — M79651 Pain in right thigh: Secondary | ICD-10-CM | POA: Diagnosis not present

## 2016-11-03 DIAGNOSIS — M0579 Rheumatoid arthritis with rheumatoid factor of multiple sites without organ or systems involvement: Secondary | ICD-10-CM | POA: Diagnosis not present

## 2016-11-03 DIAGNOSIS — M6281 Muscle weakness (generalized): Secondary | ICD-10-CM | POA: Diagnosis not present

## 2016-11-03 DIAGNOSIS — I639 Cerebral infarction, unspecified: Secondary | ICD-10-CM | POA: Diagnosis not present

## 2016-11-03 DIAGNOSIS — I214 Non-ST elevation (NSTEMI) myocardial infarction: Secondary | ICD-10-CM | POA: Diagnosis not present

## 2016-11-03 DIAGNOSIS — R296 Repeated falls: Secondary | ICD-10-CM | POA: Diagnosis not present

## 2016-11-03 DIAGNOSIS — M810 Age-related osteoporosis without current pathological fracture: Secondary | ICD-10-CM | POA: Diagnosis not present

## 2016-11-03 DIAGNOSIS — I517 Cardiomegaly: Secondary | ICD-10-CM | POA: Diagnosis not present

## 2016-11-03 DIAGNOSIS — M899 Disorder of bone, unspecified: Secondary | ICD-10-CM | POA: Diagnosis not present

## 2016-11-03 DIAGNOSIS — R262 Difficulty in walking, not elsewhere classified: Secondary | ICD-10-CM | POA: Diagnosis not present

## 2016-11-04 DIAGNOSIS — R262 Difficulty in walking, not elsewhere classified: Secondary | ICD-10-CM | POA: Diagnosis not present

## 2016-11-04 DIAGNOSIS — I639 Cerebral infarction, unspecified: Secondary | ICD-10-CM | POA: Diagnosis not present

## 2016-11-04 DIAGNOSIS — R296 Repeated falls: Secondary | ICD-10-CM | POA: Diagnosis not present

## 2016-11-04 DIAGNOSIS — M6281 Muscle weakness (generalized): Secondary | ICD-10-CM | POA: Diagnosis not present

## 2016-11-04 DIAGNOSIS — I214 Non-ST elevation (NSTEMI) myocardial infarction: Secondary | ICD-10-CM | POA: Diagnosis not present

## 2016-11-04 DIAGNOSIS — I517 Cardiomegaly: Secondary | ICD-10-CM | POA: Diagnosis not present

## 2016-11-05 DIAGNOSIS — R262 Difficulty in walking, not elsewhere classified: Secondary | ICD-10-CM | POA: Diagnosis not present

## 2016-11-05 DIAGNOSIS — R296 Repeated falls: Secondary | ICD-10-CM | POA: Diagnosis not present

## 2016-11-05 DIAGNOSIS — I214 Non-ST elevation (NSTEMI) myocardial infarction: Secondary | ICD-10-CM | POA: Diagnosis not present

## 2016-11-05 DIAGNOSIS — M6281 Muscle weakness (generalized): Secondary | ICD-10-CM | POA: Diagnosis not present

## 2016-11-05 DIAGNOSIS — I639 Cerebral infarction, unspecified: Secondary | ICD-10-CM | POA: Diagnosis not present

## 2016-11-05 DIAGNOSIS — I517 Cardiomegaly: Secondary | ICD-10-CM | POA: Diagnosis not present

## 2016-11-06 DIAGNOSIS — I639 Cerebral infarction, unspecified: Secondary | ICD-10-CM | POA: Diagnosis not present

## 2016-11-06 DIAGNOSIS — I517 Cardiomegaly: Secondary | ICD-10-CM | POA: Diagnosis not present

## 2016-11-06 DIAGNOSIS — R262 Difficulty in walking, not elsewhere classified: Secondary | ICD-10-CM | POA: Diagnosis not present

## 2016-11-06 DIAGNOSIS — R296 Repeated falls: Secondary | ICD-10-CM | POA: Diagnosis not present

## 2016-11-06 DIAGNOSIS — I214 Non-ST elevation (NSTEMI) myocardial infarction: Secondary | ICD-10-CM | POA: Diagnosis not present

## 2016-11-06 DIAGNOSIS — M6281 Muscle weakness (generalized): Secondary | ICD-10-CM | POA: Diagnosis not present

## 2016-11-09 DIAGNOSIS — M6281 Muscle weakness (generalized): Secondary | ICD-10-CM | POA: Diagnosis not present

## 2016-11-09 DIAGNOSIS — I517 Cardiomegaly: Secondary | ICD-10-CM | POA: Diagnosis not present

## 2016-11-09 DIAGNOSIS — I639 Cerebral infarction, unspecified: Secondary | ICD-10-CM | POA: Diagnosis not present

## 2016-11-09 DIAGNOSIS — I214 Non-ST elevation (NSTEMI) myocardial infarction: Secondary | ICD-10-CM | POA: Diagnosis not present

## 2016-11-09 DIAGNOSIS — R296 Repeated falls: Secondary | ICD-10-CM | POA: Diagnosis not present

## 2016-11-09 DIAGNOSIS — R262 Difficulty in walking, not elsewhere classified: Secondary | ICD-10-CM | POA: Diagnosis not present

## 2016-11-10 DIAGNOSIS — I639 Cerebral infarction, unspecified: Secondary | ICD-10-CM | POA: Diagnosis not present

## 2016-11-10 DIAGNOSIS — M6281 Muscle weakness (generalized): Secondary | ICD-10-CM | POA: Diagnosis not present

## 2016-11-10 DIAGNOSIS — I517 Cardiomegaly: Secondary | ICD-10-CM | POA: Diagnosis not present

## 2016-11-10 DIAGNOSIS — R296 Repeated falls: Secondary | ICD-10-CM | POA: Diagnosis not present

## 2016-11-10 DIAGNOSIS — R262 Difficulty in walking, not elsewhere classified: Secondary | ICD-10-CM | POA: Diagnosis not present

## 2016-11-10 DIAGNOSIS — I214 Non-ST elevation (NSTEMI) myocardial infarction: Secondary | ICD-10-CM | POA: Diagnosis not present

## 2016-11-11 DIAGNOSIS — R296 Repeated falls: Secondary | ICD-10-CM | POA: Diagnosis not present

## 2016-11-11 DIAGNOSIS — M6281 Muscle weakness (generalized): Secondary | ICD-10-CM | POA: Diagnosis not present

## 2016-11-11 DIAGNOSIS — I517 Cardiomegaly: Secondary | ICD-10-CM | POA: Diagnosis not present

## 2016-11-11 DIAGNOSIS — I639 Cerebral infarction, unspecified: Secondary | ICD-10-CM | POA: Diagnosis not present

## 2016-11-11 DIAGNOSIS — I214 Non-ST elevation (NSTEMI) myocardial infarction: Secondary | ICD-10-CM | POA: Diagnosis not present

## 2016-11-11 DIAGNOSIS — R262 Difficulty in walking, not elsewhere classified: Secondary | ICD-10-CM | POA: Diagnosis not present

## 2016-11-12 DIAGNOSIS — I214 Non-ST elevation (NSTEMI) myocardial infarction: Secondary | ICD-10-CM | POA: Diagnosis not present

## 2016-11-12 DIAGNOSIS — I517 Cardiomegaly: Secondary | ICD-10-CM | POA: Diagnosis not present

## 2016-11-12 DIAGNOSIS — M6281 Muscle weakness (generalized): Secondary | ICD-10-CM | POA: Diagnosis not present

## 2016-11-12 DIAGNOSIS — R262 Difficulty in walking, not elsewhere classified: Secondary | ICD-10-CM | POA: Diagnosis not present

## 2016-11-12 DIAGNOSIS — R296 Repeated falls: Secondary | ICD-10-CM | POA: Diagnosis not present

## 2016-11-12 DIAGNOSIS — I639 Cerebral infarction, unspecified: Secondary | ICD-10-CM | POA: Diagnosis not present

## 2016-11-13 DIAGNOSIS — I214 Non-ST elevation (NSTEMI) myocardial infarction: Secondary | ICD-10-CM | POA: Diagnosis not present

## 2016-11-13 DIAGNOSIS — R296 Repeated falls: Secondary | ICD-10-CM | POA: Diagnosis not present

## 2016-11-13 DIAGNOSIS — R262 Difficulty in walking, not elsewhere classified: Secondary | ICD-10-CM | POA: Diagnosis not present

## 2016-11-13 DIAGNOSIS — I517 Cardiomegaly: Secondary | ICD-10-CM | POA: Diagnosis not present

## 2016-11-13 DIAGNOSIS — I639 Cerebral infarction, unspecified: Secondary | ICD-10-CM | POA: Diagnosis not present

## 2016-11-13 DIAGNOSIS — M6281 Muscle weakness (generalized): Secondary | ICD-10-CM | POA: Diagnosis not present

## 2016-11-16 DIAGNOSIS — R296 Repeated falls: Secondary | ICD-10-CM | POA: Diagnosis not present

## 2016-11-16 DIAGNOSIS — I639 Cerebral infarction, unspecified: Secondary | ICD-10-CM | POA: Diagnosis not present

## 2016-11-16 DIAGNOSIS — M6281 Muscle weakness (generalized): Secondary | ICD-10-CM | POA: Diagnosis not present

## 2016-11-16 DIAGNOSIS — R262 Difficulty in walking, not elsewhere classified: Secondary | ICD-10-CM | POA: Diagnosis not present

## 2016-11-16 DIAGNOSIS — I214 Non-ST elevation (NSTEMI) myocardial infarction: Secondary | ICD-10-CM | POA: Diagnosis not present

## 2016-11-16 DIAGNOSIS — I517 Cardiomegaly: Secondary | ICD-10-CM | POA: Diagnosis not present

## 2016-11-17 DIAGNOSIS — I639 Cerebral infarction, unspecified: Secondary | ICD-10-CM | POA: Diagnosis not present

## 2016-11-17 DIAGNOSIS — M6281 Muscle weakness (generalized): Secondary | ICD-10-CM | POA: Diagnosis not present

## 2016-11-17 DIAGNOSIS — R262 Difficulty in walking, not elsewhere classified: Secondary | ICD-10-CM | POA: Diagnosis not present

## 2016-11-17 DIAGNOSIS — I214 Non-ST elevation (NSTEMI) myocardial infarction: Secondary | ICD-10-CM | POA: Diagnosis not present

## 2016-11-17 DIAGNOSIS — R296 Repeated falls: Secondary | ICD-10-CM | POA: Diagnosis not present

## 2016-11-17 DIAGNOSIS — I517 Cardiomegaly: Secondary | ICD-10-CM | POA: Diagnosis not present

## 2016-11-18 DIAGNOSIS — R296 Repeated falls: Secondary | ICD-10-CM | POA: Diagnosis not present

## 2016-11-18 DIAGNOSIS — M6281 Muscle weakness (generalized): Secondary | ICD-10-CM | POA: Diagnosis not present

## 2016-11-18 DIAGNOSIS — R262 Difficulty in walking, not elsewhere classified: Secondary | ICD-10-CM | POA: Diagnosis not present

## 2016-11-18 DIAGNOSIS — I517 Cardiomegaly: Secondary | ICD-10-CM | POA: Diagnosis not present

## 2016-11-18 DIAGNOSIS — I214 Non-ST elevation (NSTEMI) myocardial infarction: Secondary | ICD-10-CM | POA: Diagnosis not present

## 2016-11-18 DIAGNOSIS — I639 Cerebral infarction, unspecified: Secondary | ICD-10-CM | POA: Diagnosis not present

## 2016-11-19 DIAGNOSIS — M6281 Muscle weakness (generalized): Secondary | ICD-10-CM | POA: Diagnosis not present

## 2016-11-19 DIAGNOSIS — R262 Difficulty in walking, not elsewhere classified: Secondary | ICD-10-CM | POA: Diagnosis not present

## 2016-11-19 DIAGNOSIS — R296 Repeated falls: Secondary | ICD-10-CM | POA: Diagnosis not present

## 2016-11-19 DIAGNOSIS — I214 Non-ST elevation (NSTEMI) myocardial infarction: Secondary | ICD-10-CM | POA: Diagnosis not present

## 2016-11-19 DIAGNOSIS — I639 Cerebral infarction, unspecified: Secondary | ICD-10-CM | POA: Diagnosis not present

## 2016-11-19 DIAGNOSIS — I517 Cardiomegaly: Secondary | ICD-10-CM | POA: Diagnosis not present

## 2016-11-20 DIAGNOSIS — R296 Repeated falls: Secondary | ICD-10-CM | POA: Diagnosis not present

## 2016-11-20 DIAGNOSIS — I517 Cardiomegaly: Secondary | ICD-10-CM | POA: Diagnosis not present

## 2016-11-20 DIAGNOSIS — M6281 Muscle weakness (generalized): Secondary | ICD-10-CM | POA: Diagnosis not present

## 2016-11-20 DIAGNOSIS — I214 Non-ST elevation (NSTEMI) myocardial infarction: Secondary | ICD-10-CM | POA: Diagnosis not present

## 2016-11-20 DIAGNOSIS — I639 Cerebral infarction, unspecified: Secondary | ICD-10-CM | POA: Diagnosis not present

## 2016-11-20 DIAGNOSIS — R262 Difficulty in walking, not elsewhere classified: Secondary | ICD-10-CM | POA: Diagnosis not present

## 2016-11-23 DIAGNOSIS — R296 Repeated falls: Secondary | ICD-10-CM | POA: Diagnosis not present

## 2016-11-23 DIAGNOSIS — I214 Non-ST elevation (NSTEMI) myocardial infarction: Secondary | ICD-10-CM | POA: Diagnosis not present

## 2016-11-23 DIAGNOSIS — I517 Cardiomegaly: Secondary | ICD-10-CM | POA: Diagnosis not present

## 2016-11-23 DIAGNOSIS — I639 Cerebral infarction, unspecified: Secondary | ICD-10-CM | POA: Diagnosis not present

## 2016-11-23 DIAGNOSIS — M6281 Muscle weakness (generalized): Secondary | ICD-10-CM | POA: Diagnosis not present

## 2016-11-23 DIAGNOSIS — R262 Difficulty in walking, not elsewhere classified: Secondary | ICD-10-CM | POA: Diagnosis not present

## 2016-11-24 DIAGNOSIS — R262 Difficulty in walking, not elsewhere classified: Secondary | ICD-10-CM | POA: Diagnosis not present

## 2016-11-24 DIAGNOSIS — R296 Repeated falls: Secondary | ICD-10-CM | POA: Diagnosis not present

## 2016-11-24 DIAGNOSIS — I639 Cerebral infarction, unspecified: Secondary | ICD-10-CM | POA: Diagnosis not present

## 2016-11-24 DIAGNOSIS — M6281 Muscle weakness (generalized): Secondary | ICD-10-CM | POA: Diagnosis not present

## 2016-11-24 DIAGNOSIS — I214 Non-ST elevation (NSTEMI) myocardial infarction: Secondary | ICD-10-CM | POA: Diagnosis not present

## 2016-11-24 DIAGNOSIS — I517 Cardiomegaly: Secondary | ICD-10-CM | POA: Diagnosis not present

## 2016-12-03 DIAGNOSIS — F419 Anxiety disorder, unspecified: Secondary | ICD-10-CM | POA: Diagnosis not present

## 2016-12-03 DIAGNOSIS — I4891 Unspecified atrial fibrillation: Secondary | ICD-10-CM | POA: Diagnosis not present

## 2016-12-03 DIAGNOSIS — F329 Major depressive disorder, single episode, unspecified: Secondary | ICD-10-CM | POA: Diagnosis not present

## 2016-12-03 DIAGNOSIS — I2510 Atherosclerotic heart disease of native coronary artery without angina pectoris: Secondary | ICD-10-CM | POA: Diagnosis not present

## 2016-12-03 DIAGNOSIS — Z7901 Long term (current) use of anticoagulants: Secondary | ICD-10-CM | POA: Diagnosis not present

## 2016-12-03 DIAGNOSIS — K5900 Constipation, unspecified: Secondary | ICD-10-CM | POA: Diagnosis not present

## 2016-12-03 DIAGNOSIS — M0579 Rheumatoid arthritis with rheumatoid factor of multiple sites without organ or systems involvement: Secondary | ICD-10-CM | POA: Diagnosis not present

## 2016-12-03 DIAGNOSIS — K219 Gastro-esophageal reflux disease without esophagitis: Secondary | ICD-10-CM | POA: Diagnosis not present

## 2016-12-03 DIAGNOSIS — M810 Age-related osteoporosis without current pathological fracture: Secondary | ICD-10-CM | POA: Diagnosis not present

## 2016-12-03 DIAGNOSIS — I10 Essential (primary) hypertension: Secondary | ICD-10-CM | POA: Diagnosis not present

## 2016-12-03 DIAGNOSIS — Z8673 Personal history of transient ischemic attack (TIA), and cerebral infarction without residual deficits: Secondary | ICD-10-CM | POA: Diagnosis not present

## 2016-12-03 DIAGNOSIS — E785 Hyperlipidemia, unspecified: Secondary | ICD-10-CM | POA: Diagnosis not present

## 2016-12-05 DIAGNOSIS — N390 Urinary tract infection, site not specified: Secondary | ICD-10-CM | POA: Diagnosis not present

## 2016-12-05 DIAGNOSIS — E785 Hyperlipidemia, unspecified: Secondary | ICD-10-CM | POA: Diagnosis not present

## 2016-12-05 DIAGNOSIS — I10 Essential (primary) hypertension: Secondary | ICD-10-CM | POA: Diagnosis not present

## 2016-12-09 DIAGNOSIS — F331 Major depressive disorder, recurrent, moderate: Secondary | ICD-10-CM | POA: Diagnosis not present

## 2016-12-09 DIAGNOSIS — F29 Unspecified psychosis not due to a substance or known physiological condition: Secondary | ICD-10-CM | POA: Diagnosis not present

## 2016-12-09 DIAGNOSIS — R451 Restlessness and agitation: Secondary | ICD-10-CM | POA: Diagnosis not present

## 2016-12-09 DIAGNOSIS — G4700 Insomnia, unspecified: Secondary | ICD-10-CM | POA: Diagnosis not present

## 2016-12-10 DIAGNOSIS — Z961 Presence of intraocular lens: Secondary | ICD-10-CM | POA: Diagnosis not present

## 2016-12-10 DIAGNOSIS — I10 Essential (primary) hypertension: Secondary | ICD-10-CM | POA: Diagnosis not present

## 2016-12-10 DIAGNOSIS — H1851 Endothelial corneal dystrophy: Secondary | ICD-10-CM | POA: Diagnosis not present

## 2016-12-10 DIAGNOSIS — Z79899 Other long term (current) drug therapy: Secondary | ICD-10-CM | POA: Diagnosis not present

## 2016-12-21 DIAGNOSIS — M0579 Rheumatoid arthritis with rheumatoid factor of multiple sites without organ or systems involvement: Secondary | ICD-10-CM | POA: Diagnosis not present

## 2016-12-21 DIAGNOSIS — T8149XA Infection following a procedure, other surgical site, initial encounter: Secondary | ICD-10-CM | POA: Diagnosis not present

## 2016-12-21 DIAGNOSIS — M869 Osteomyelitis, unspecified: Secondary | ICD-10-CM | POA: Diagnosis not present

## 2016-12-21 DIAGNOSIS — Z792 Long term (current) use of antibiotics: Secondary | ICD-10-CM | POA: Diagnosis not present

## 2016-12-23 DIAGNOSIS — I739 Peripheral vascular disease, unspecified: Secondary | ICD-10-CM | POA: Diagnosis not present

## 2016-12-23 DIAGNOSIS — L603 Nail dystrophy: Secondary | ICD-10-CM | POA: Diagnosis not present

## 2016-12-23 DIAGNOSIS — B351 Tinea unguium: Secondary | ICD-10-CM | POA: Diagnosis not present

## 2017-01-06 IMAGING — CR DG FEMUR 2+V*L*
4 series · 4 of 4 positions shown · non-contrast
Comparison: None.

CLINICAL DATA: Acute fall today with left leg pain. Initial
encounter.

EXAM:
LEFT FEMUR 2 VIEWS

[femur ap (1 of 2)]
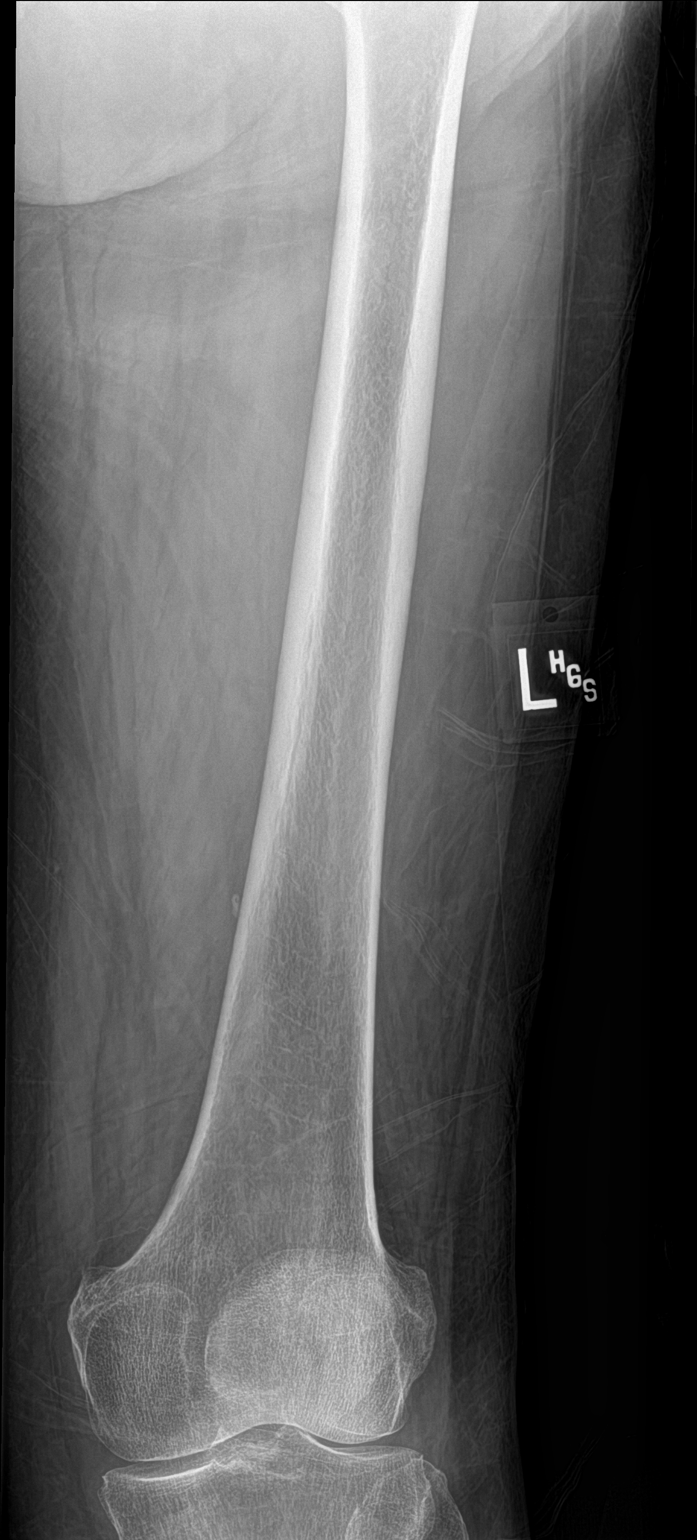

[femur lat (1 of 2)]
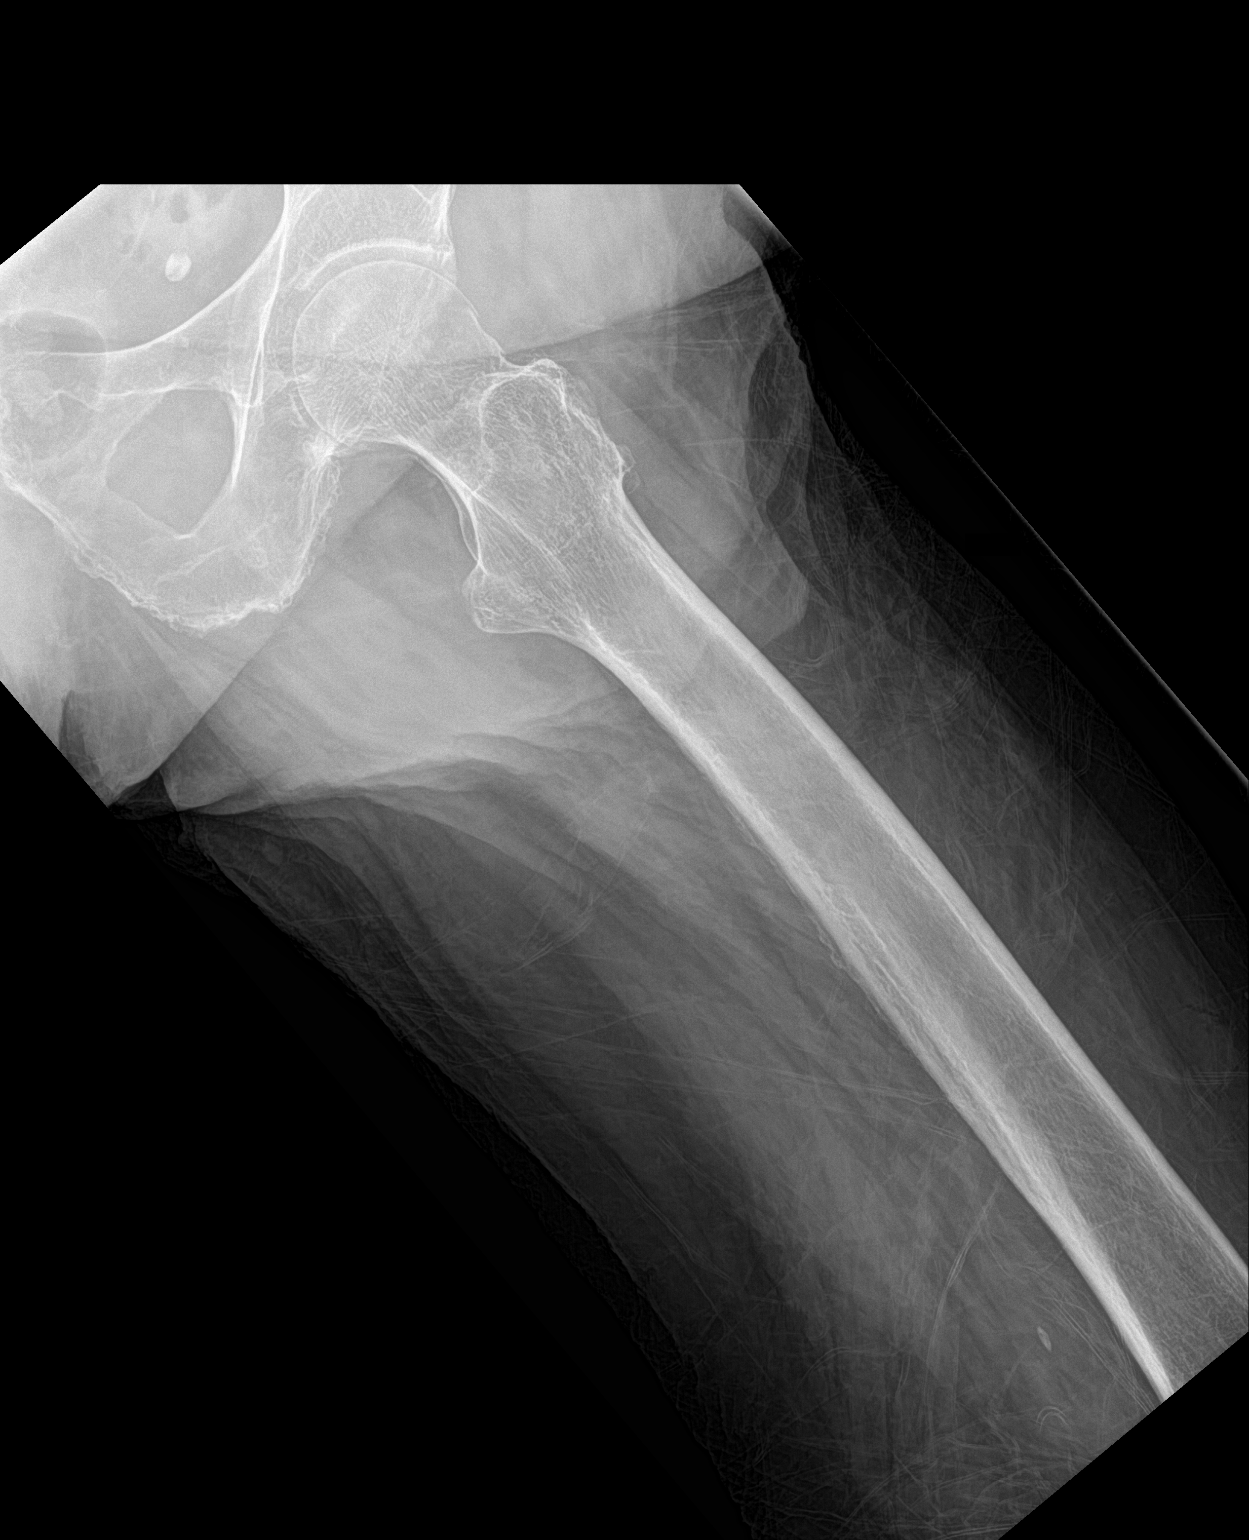

[femur ap (2 of 2)]
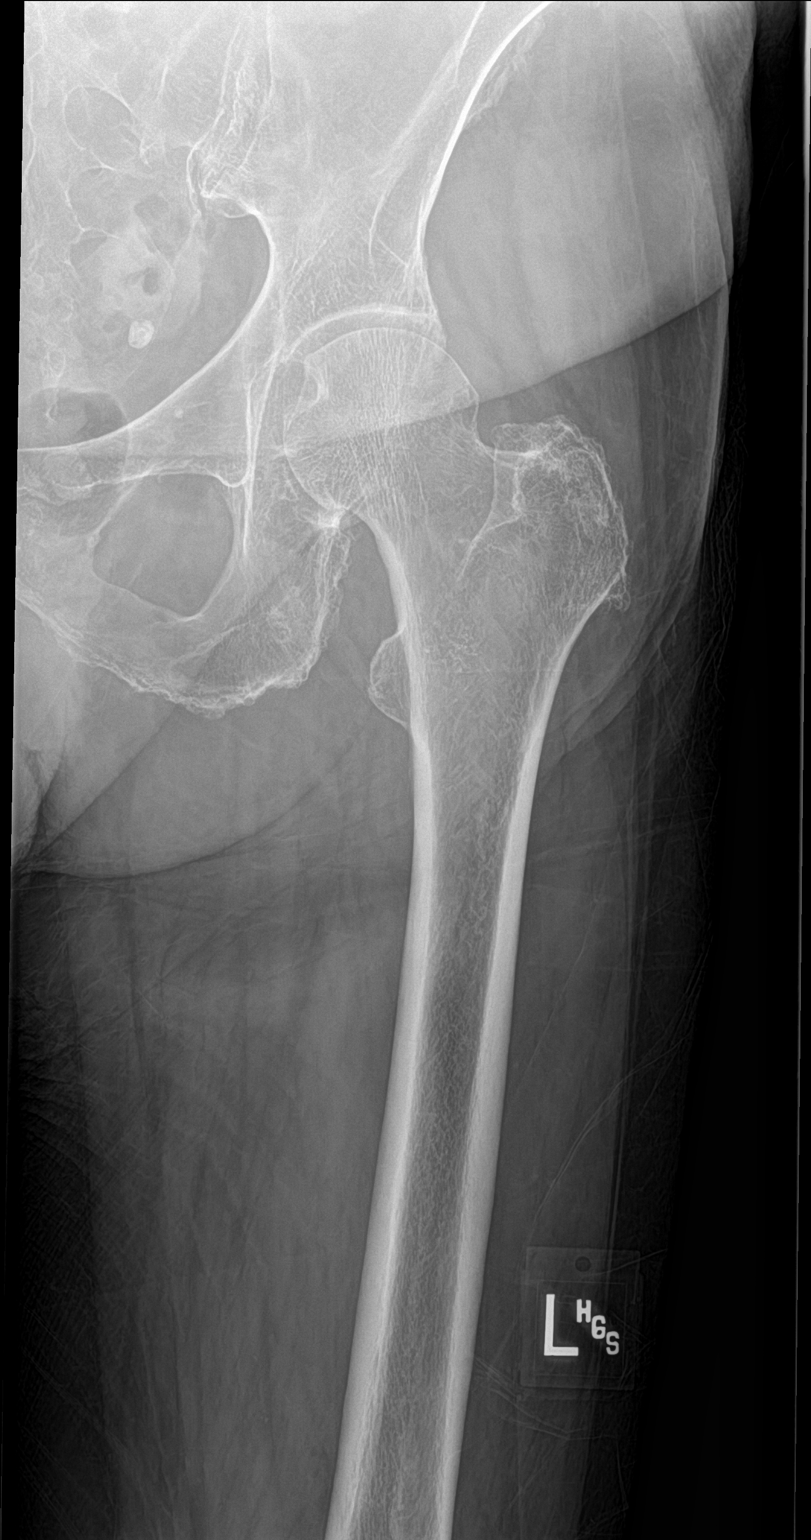

[femur lat (2 of 2)]
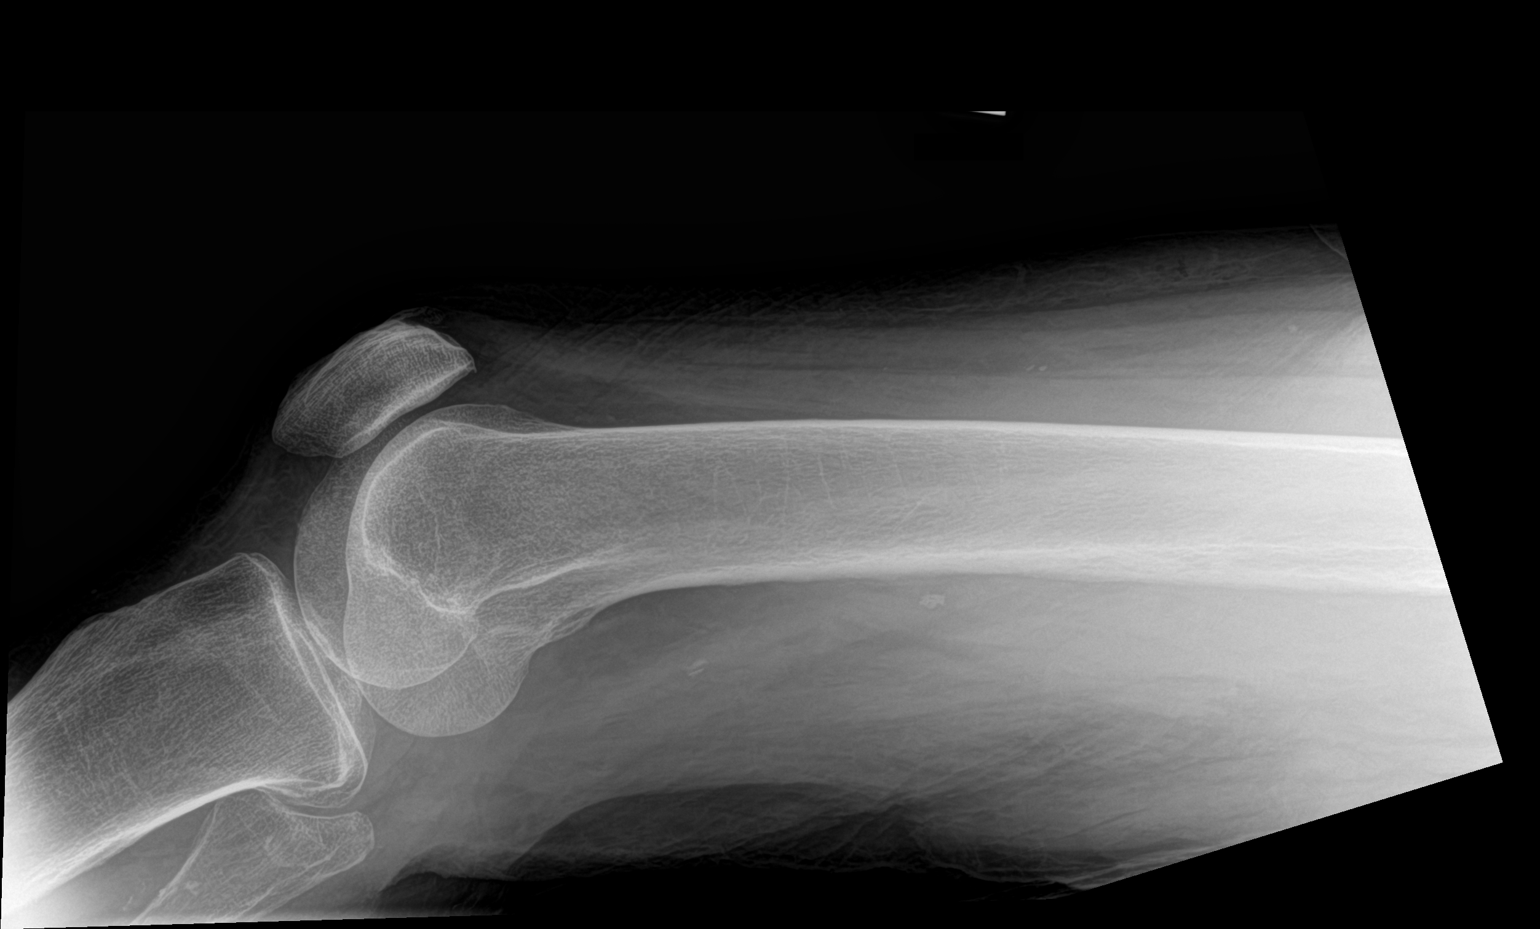

[4 of 4 positions shown; findings below may reference images not displayed]

FINDINGS: There is no evidence of acute fracture, subluxation or dislocation.

Mild degenerative changes within the hip and knee noted.

No focal bony lesions are identified.
IMPRESSION: No evidence of acute bony abnormality.

## 2017-01-07 IMAGING — CR DG HIP (WITH OR WITHOUT PELVIS) 1V PORT*R*
1 series · 2 of 2 positions shown · non-contrast
Comparison: 09/29/2014 radiograph

CLINICAL DATA: Right hip hemiarthroplasty.

EXAM:
DG HIP (WITH OR WITHOUT PELVIS) 1V PORT RIGHT

[Series 1: ap · 0.17mm/px · 2 of 2 slices shown]
[im 1/2]
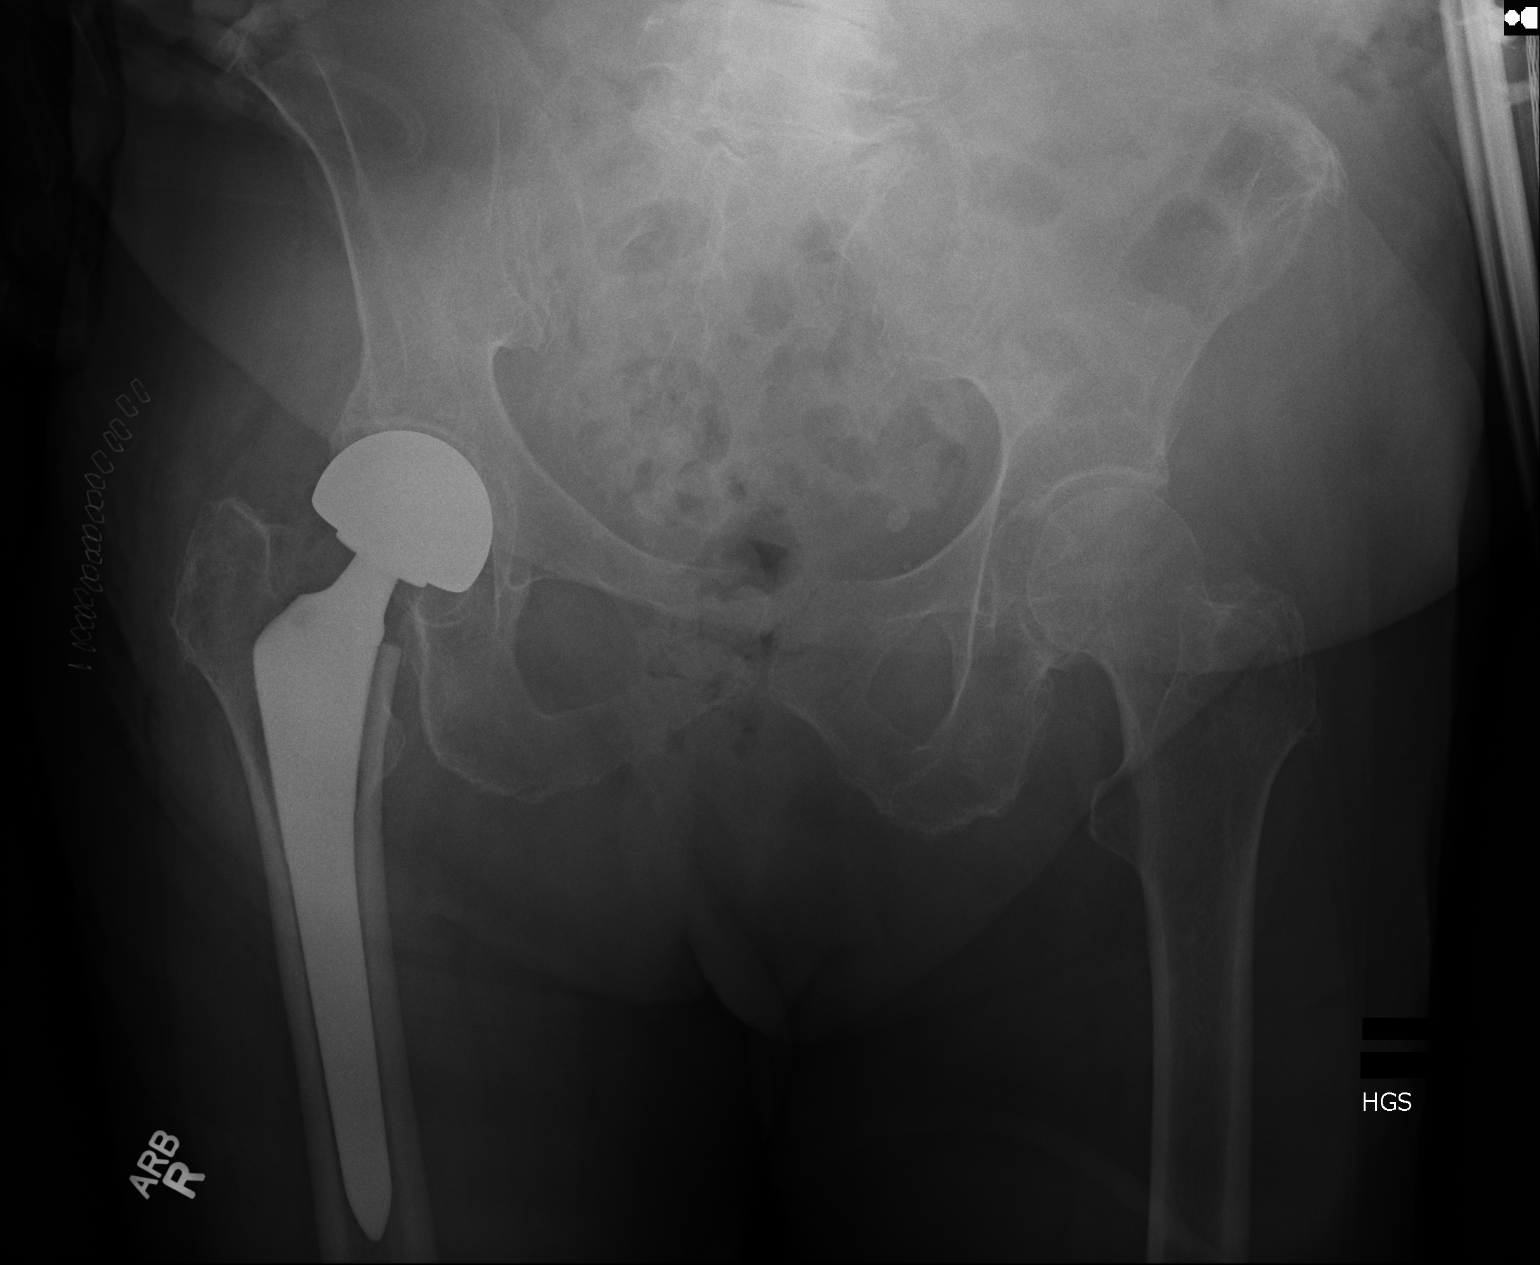
[im 2/2]
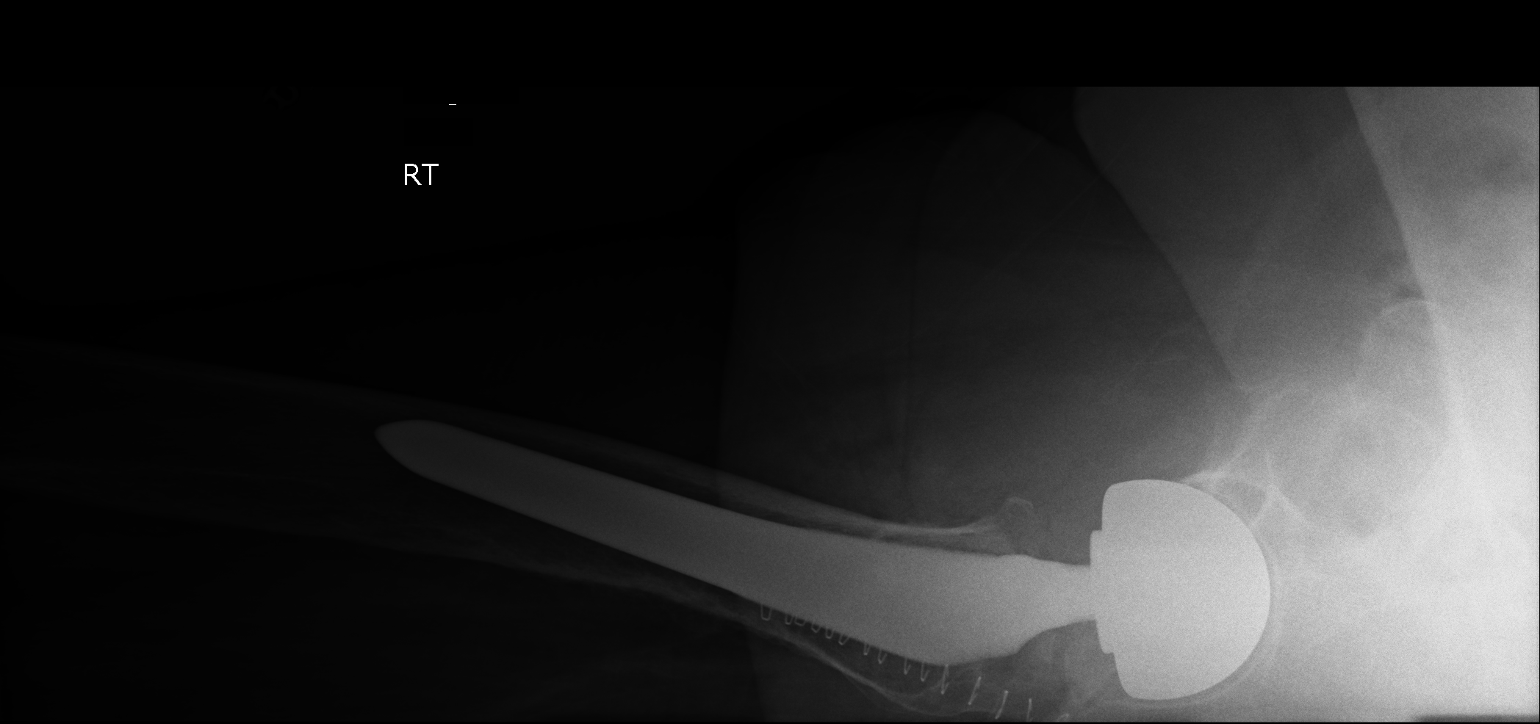

[2 of 2 positions shown; findings below may reference images not displayed]

FINDINGS: Right hip hemiarthroplasty identified without complicating features.

There is no evidence of acute fracture or subluxation.
IMPRESSION: Right hip hemiarthroplasty without complicating features.

## 2017-01-12 DIAGNOSIS — M869 Osteomyelitis, unspecified: Secondary | ICD-10-CM | POA: Diagnosis not present

## 2018-02-19 ENCOUNTER — Other Ambulatory Visit
Admission: RE | Admit: 2018-02-19 | Discharge: 2018-02-19 | Disposition: A | Discharge status: Home / Self Care | Payer: Medicare Other | Source: Ambulatory Visit | Attending: Family Medicine | Admitting: Family Medicine

## 2018-02-19 DIAGNOSIS — I10 Essential (primary) hypertension: Secondary | ICD-10-CM | POA: Insufficient documentation

## 2018-02-19 DIAGNOSIS — N390 Urinary tract infection, site not specified: Secondary | ICD-10-CM | POA: Insufficient documentation

## 2018-02-19 LAB — URINALYSIS, COMPLETE (UACMP) WITH MICROSCOPIC
BACTERIA UA: NONE SEEN
Bilirubin Urine: NEGATIVE
GLUCOSE, UA: NEGATIVE mg/dL
HGB URINE DIPSTICK: NEGATIVE
Ketones, ur: NEGATIVE mg/dL
Leukocytes, UA: NEGATIVE
NITRITE: NEGATIVE
Protein, ur: NEGATIVE mg/dL
Specific Gravity, Urine: 1.014 (ref 1.005–1.030)
pH: 6 (ref 5.0–8.0)

## 2018-02-19 LAB — COMPREHENSIVE METABOLIC PANEL
ALBUMIN: 3.5 g/dL (ref 3.5–5.0)
ALK PHOS: 43 U/L (ref 38–126)
ALT: 13 U/L (ref 0–44)
AST: 13 U/L — AB (ref 15–41)
Anion gap: 6 (ref 5–15)
BUN: 15 mg/dL (ref 8–23)
CALCIUM: 8.4 mg/dL — AB (ref 8.9–10.3)
CHLORIDE: 105 mmol/L (ref 98–111)
CO2: 31 mmol/L (ref 22–32)
CREATININE: 0.65 mg/dL (ref 0.44–1.00)
GFR calc Af Amer: >60 mL/min (ref >60)
GFR calc non Af Amer: >60 mL/min (ref >60)
Glucose, Bld: 88 mg/dL (ref 70–99)
Potassium: 4 mmol/L (ref 3.5–5.1)
SODIUM: 142 mmol/L (ref 135–145)
Total Bilirubin: 0.9 mg/dL (ref 0.3–1.2)
Total Protein: 5.8 g/dL — ABNORMAL LOW (ref 6.5–8.1)

## 2018-02-19 LAB — CBC WITH DIFFERENTIAL/PLATELET
Abs Immature Granulocytes: 0.03 10*3/uL (ref 0.00–0.07)
BASOS ABS: 0 10*3/uL (ref 0.0–0.1)
Basophils Relative: 0 %
EOS ABS: 0.4 10*3/uL (ref 0.0–0.5)
Eosinophils Relative: 5 %
HCT: 36.9 % (ref 36.0–46.0)
Hemoglobin: 10.9 g/dL — ABNORMAL LOW (ref 12.0–15.0)
IMMATURE GRANULOCYTES: 0 %
LYMPHS ABS: 2 10*3/uL (ref 0.7–4.0)
Lymphocytes Relative: 27 %
MCH: 28 pg (ref 26.0–34.0)
MCHC: 29.5 g/dL — ABNORMAL LOW (ref 30.0–36.0)
MCV: 94.9 fL (ref 80.0–100.0)
MONO ABS: 0.9 10*3/uL (ref 0.1–1.0)
Monocytes Relative: 12 %
NRBC: 0 % (ref 0.0–0.2)
Neutro Abs: 4.1 10*3/uL (ref 1.7–7.7)
Neutrophils Relative %: 56 %
PLATELETS: 157 10*3/uL (ref 150–400)
RBC: 3.89 MIL/uL (ref 3.87–5.11)
RDW: 14.1 % (ref 11.5–15.5)
WBC: 7.3 10*3/uL (ref 4.0–10.5)

## 2018-02-22 LAB — URINE CULTURE: Culture: 50000 — AB

## 2018-03-27 DEATH — deceased
